# Patient Record
Sex: Male | Born: 1950
Health system: Southern US, Community
[De-identification: ages and names within clinical notes are randomized; demographics above are authoritative.]

## PROBLEM LIST (undated history)

## (undated) DIAGNOSIS — T7840XA Allergy, unspecified, initial encounter: Secondary | ICD-10-CM

## (undated) DIAGNOSIS — F32A Depression, unspecified: Secondary | ICD-10-CM

## (undated) DIAGNOSIS — J189 Pneumonia, unspecified organism: Secondary | ICD-10-CM

## (undated) DIAGNOSIS — E669 Obesity, unspecified: Secondary | ICD-10-CM

## (undated) DIAGNOSIS — K5790 Diverticulosis of intestine, part unspecified, without perforation or abscess without bleeding: Secondary | ICD-10-CM

## (undated) DIAGNOSIS — D509 Iron deficiency anemia, unspecified: Secondary | ICD-10-CM

## (undated) DIAGNOSIS — K219 Gastro-esophageal reflux disease without esophagitis: Secondary | ICD-10-CM

## (undated) DIAGNOSIS — E785 Hyperlipidemia, unspecified: Secondary | ICD-10-CM

## (undated) DIAGNOSIS — M1990 Unspecified osteoarthritis, unspecified site: Secondary | ICD-10-CM

## (undated) DIAGNOSIS — K635 Polyp of colon: Secondary | ICD-10-CM

## (undated) DIAGNOSIS — R7303 Prediabetes: Secondary | ICD-10-CM

## (undated) HISTORY — DX: Polyp of colon: K63.5

## (undated) HISTORY — DX: Prediabetes: R73.03

## (undated) HISTORY — DX: Gastro-esophageal reflux disease without esophagitis: K21.9

## (undated) HISTORY — DX: Allergy, unspecified, initial encounter: T78.40XA

## (undated) HISTORY — DX: Iron deficiency anemia, unspecified: D50.9

## (undated) HISTORY — DX: Unspecified osteoarthritis, unspecified site: M19.90

## (undated) HISTORY — DX: Obesity, unspecified: E66.9

## (undated) HISTORY — PX: BAND HEMORRHOIDECTOMY: SHX1213

## (undated) HISTORY — DX: Hyperlipidemia, unspecified: E78.5

## (undated) HISTORY — DX: Diverticulosis of intestine, part unspecified, without perforation or abscess without bleeding: K57.90

## (undated) HISTORY — PX: POLYPECTOMY: SHX149

## (undated) HISTORY — PX: UPPER GASTROINTESTINAL ENDOSCOPY: SHX188

## (undated) HISTORY — PX: TONSILLECTOMY: SUR1361

---

## 2002-06-10 HISTORY — PX: OTHER SURGICAL HISTORY: SHX169

## 2003-03-31 ENCOUNTER — Encounter: Payer: Self-pay | Admitting: Internal Medicine

## 2003-03-31 ENCOUNTER — Ambulatory Visit (HOSPITAL_COMMUNITY): Admission: RE | Admit: 2003-03-31 | Discharge: 2003-03-31 | Payer: Self-pay | Admitting: Gastroenterology

## 2003-03-31 ENCOUNTER — Encounter (INDEPENDENT_AMBULATORY_CARE_PROVIDER_SITE_OTHER): Payer: Self-pay | Admitting: *Deleted

## 2007-04-14 ENCOUNTER — Ambulatory Visit: Payer: Self-pay | Admitting: Internal Medicine

## 2007-04-14 DIAGNOSIS — Z860100 Personal history of colon polyps, unspecified: Secondary | ICD-10-CM | POA: Insufficient documentation

## 2007-04-14 DIAGNOSIS — F528 Other sexual dysfunction not due to a substance or known physiological condition: Secondary | ICD-10-CM | POA: Insufficient documentation

## 2007-04-14 DIAGNOSIS — E782 Mixed hyperlipidemia: Secondary | ICD-10-CM | POA: Insufficient documentation

## 2007-04-14 DIAGNOSIS — K625 Hemorrhage of anus and rectum: Secondary | ICD-10-CM | POA: Insufficient documentation

## 2007-04-14 DIAGNOSIS — K219 Gastro-esophageal reflux disease without esophagitis: Secondary | ICD-10-CM | POA: Insufficient documentation

## 2007-04-17 ENCOUNTER — Ambulatory Visit: Payer: Self-pay | Admitting: Internal Medicine

## 2007-04-17 DIAGNOSIS — D509 Iron deficiency anemia, unspecified: Secondary | ICD-10-CM | POA: Insufficient documentation

## 2007-04-17 DIAGNOSIS — R7981 Abnormal blood-gas level: Secondary | ICD-10-CM | POA: Insufficient documentation

## 2007-04-17 LAB — CONVERTED CEMR LAB
ALT: 22 units/L (ref 0–53)
Alkaline Phosphatase: 49 units/L (ref 39–117)
BUN: 20 mg/dL (ref 6–23)
Basophils Absolute: 0 10*3/uL (ref 0.0–0.1)
Bilirubin Urine: NEGATIVE
Calcium: 9.4 mg/dL (ref 8.4–10.5)
Direct LDL: 166.5 mg/dL
GFR calc Af Amer: 113 mL/min
Glucose, Bld: 106 mg/dL — ABNORMAL HIGH (ref 70–99)
HCT: 41.6 % (ref 39.0–52.0)
Ketones, ur: NEGATIVE mg/dL
MCHC: 33 g/dL (ref 30.0–36.0)
MCV: 82.7 fL (ref 78.0–100.0)
Monocytes Relative: 11.8 % — ABNORMAL HIGH (ref 3.0–11.0)
Neutrophils Relative %: 56.7 % (ref 43.0–77.0)
Potassium: 4.9 meq/L (ref 3.5–5.1)
RDW: 14.3 % (ref 11.5–14.6)
Sex Hormone Binding: 20 nmol/L (ref 13–71)
TSH: 1.45 microintl units/mL (ref 0.35–5.50)
Testosterone Free: 70.5 pg/mL (ref 47.0–244.0)
Urine Glucose: NEGATIVE mg/dL
VLDL: 42 mg/dL — ABNORMAL HIGH (ref 0–40)
WBC: 6.5 10*3/uL (ref 4.5–10.5)

## 2007-06-03 ENCOUNTER — Encounter: Payer: Self-pay | Admitting: Internal Medicine

## 2007-06-16 ENCOUNTER — Ambulatory Visit: Payer: Self-pay | Admitting: Internal Medicine

## 2007-10-12 ENCOUNTER — Encounter: Payer: Self-pay | Admitting: Internal Medicine

## 2008-08-30 ENCOUNTER — Ambulatory Visit: Payer: Self-pay | Admitting: Internal Medicine

## 2008-09-06 ENCOUNTER — Ambulatory Visit: Payer: Self-pay | Admitting: Internal Medicine

## 2008-09-06 ENCOUNTER — Telehealth: Payer: Self-pay | Admitting: Internal Medicine

## 2008-09-06 DIAGNOSIS — M791 Myalgia, unspecified site: Secondary | ICD-10-CM | POA: Insufficient documentation

## 2008-09-06 LAB — CONVERTED CEMR LAB
Alkaline Phosphatase: 54 units/L (ref 39–117)
HDL: 34.7 mg/dL — ABNORMAL LOW (ref >39.00)
LDL Cholesterol: 68 mg/dL (ref 0–99)
Total Bilirubin: 0.6 mg/dL (ref 0.3–1.2)
Total Protein: 7.5 g/dL (ref 6.0–8.3)
VLDL: 23.6 mg/dL (ref 0.0–40.0)

## 2008-09-19 ENCOUNTER — Telehealth: Payer: Self-pay | Admitting: Internal Medicine

## 2008-09-19 ENCOUNTER — Ambulatory Visit: Payer: Self-pay | Admitting: Internal Medicine

## 2008-09-19 LAB — CONVERTED CEMR LAB
LDL Cholesterol: 72 mg/dL (ref 0–99)
Total CK: 810 units/L — ABNORMAL HIGH (ref 7–232)
Triglycerides: 118 mg/dL (ref 0.0–149.0)

## 2008-11-23 ENCOUNTER — Encounter: Payer: Self-pay | Admitting: Internal Medicine

## 2009-01-23 ENCOUNTER — Ambulatory Visit: Payer: Self-pay | Admitting: Internal Medicine

## 2009-01-23 DIAGNOSIS — R5383 Other fatigue: Secondary | ICD-10-CM

## 2009-01-23 DIAGNOSIS — R5381 Other malaise: Secondary | ICD-10-CM | POA: Insufficient documentation

## 2009-01-23 LAB — CONVERTED CEMR LAB
BUN: 16 mg/dL (ref 6–23)
Basophils Relative: 1 % (ref 0–1)
CO2: 22 meq/L (ref 19–32)
Calcium: 9.5 mg/dL (ref 8.4–10.5)
Chloride: 107 meq/L (ref 96–112)
Creatinine, Ser: 1 mg/dL (ref 0.40–1.50)
Free T4: 0.95 ng/dL (ref 0.80–1.80)
Glucose, Bld: 104 mg/dL — ABNORMAL HIGH (ref 70–99)
HCT: 38.5 % — ABNORMAL LOW (ref 39.0–52.0)
Hemoglobin: 11.7 g/dL — ABNORMAL LOW (ref 13.0–17.0)
Lymphs Abs: 3.1 10*3/uL (ref 0.7–4.0)
MCHC: 30.4 g/dL (ref 30.0–36.0)
Monocytes Absolute: 1.2 10*3/uL — ABNORMAL HIGH (ref 0.1–1.0)
Monocytes Relative: 12 % (ref 3–12)
Neutro Abs: 5.8 10*3/uL (ref 1.7–7.7)
Neutrophils Relative %: 55 % (ref 43–77)
Platelets: 371 10*3/uL (ref 150–400)
RBC Folate: 1031 ng/mL — ABNORMAL HIGH (ref 180–600)
RBC: 5.61 M/uL (ref 4.22–5.81)
WBC: 10.4 10*3/uL (ref 4.0–10.5)

## 2009-02-05 ENCOUNTER — Encounter: Payer: Self-pay | Admitting: Internal Medicine

## 2009-02-23 ENCOUNTER — Ambulatory Visit: Payer: Self-pay | Admitting: Internal Medicine

## 2009-02-23 LAB — CONVERTED CEMR LAB
HDL: 40 mg/dL (ref >39)
LDL Cholesterol: 158 mg/dL — ABNORMAL HIGH (ref 0–99)
Total CHOL/HDL Ratio: 6.2
Triglycerides: 238 mg/dL — ABNORMAL HIGH (ref <150)
VLDL: 48 mg/dL — ABNORMAL HIGH (ref 0–40)

## 2009-03-02 ENCOUNTER — Ambulatory Visit: Payer: Self-pay | Admitting: Internal Medicine

## 2009-08-21 ENCOUNTER — Ambulatory Visit: Payer: Self-pay | Admitting: Internal Medicine

## 2009-08-21 LAB — CONVERTED CEMR LAB
Basophils Absolute: 0 10*3/uL (ref 0.0–0.1)
Basophils Relative: 1 % (ref 0–1)
Eosinophils Relative: 3 % (ref 0–5)
Hemoglobin: 13.4 g/dL (ref 13.0–17.0)
Lymphs Abs: 2.3 10*3/uL (ref 0.7–4.0)
Monocytes Absolute: 1 10*3/uL (ref 0.1–1.0)
Monocytes Relative: 14 % — ABNORMAL HIGH (ref 3–12)
Triglycerides: 238 mg/dL — ABNORMAL HIGH (ref <150)
WBC: 7.5 10*3/uL (ref 4.0–10.5)

## 2009-08-28 ENCOUNTER — Ambulatory Visit: Payer: Self-pay | Admitting: Internal Medicine

## 2009-08-28 DIAGNOSIS — H8110 Benign paroxysmal vertigo, unspecified ear: Secondary | ICD-10-CM | POA: Insufficient documentation

## 2009-11-06 ENCOUNTER — Emergency Department (HOSPITAL_COMMUNITY): Admission: EM | Admit: 2009-11-06 | Discharge: 2009-11-06 | Payer: Self-pay | Admitting: Emergency Medicine

## 2009-11-17 ENCOUNTER — Ambulatory Visit: Payer: Self-pay | Admitting: Interventional Radiology

## 2009-11-17 ENCOUNTER — Ambulatory Visit (HOSPITAL_BASED_OUTPATIENT_CLINIC_OR_DEPARTMENT_OTHER): Admission: RE | Admit: 2009-11-17 | Discharge: 2009-11-17 | Payer: Self-pay | Admitting: Internal Medicine

## 2009-11-17 ENCOUNTER — Ambulatory Visit: Payer: Self-pay | Admitting: Internal Medicine

## 2009-11-17 DIAGNOSIS — M79609 Pain in unspecified limb: Secondary | ICD-10-CM | POA: Insufficient documentation

## 2009-11-17 DIAGNOSIS — S2239XA Fracture of one rib, unspecified side, initial encounter for closed fracture: Secondary | ICD-10-CM | POA: Insufficient documentation

## 2010-03-05 ENCOUNTER — Ambulatory Visit: Payer: Self-pay | Admitting: Internal Medicine

## 2010-07-12 NOTE — Assessment & Plan Note (Signed)
Summary: Dizziness/vertigo/MHF   Vital Signs:  Patient profile:   60 year old male Height:      70 inches Weight:      247 pounds BMI:     35.57 O2 Sat:      97 % on Room air Temp:     97.9 degrees F oral Pulse rate:   74 / minute Pulse rhythm:   regular Resp:     18 per minute BP sitting:   124 / 70  (right arm) Cuff size:   large  Vitals Entered By: Glendell Docker CMA (August 28, 2009 8:28 AM)  O2 Flow:  Room air CC: Rm 2- CPX   Primary Care Provider:  Dondra Spry DO  CC:  Rm 2- CPX.  History of Present Illness: 60 y/o with hx of hyperlipidemia, anemia and obesity for f/u  last 3 wks - he co intermitent vertigo symptoms related to changes in position  hyperlipidemia - pt can not tolerate statins    Preventive Screening-Counseling & Management  Alcohol-Tobacco     Smoking Status: current- cigar  Allergies: 1)  ! Crestor (Rosuvastatin Calcium)  Past History:  Past Medical History: Hyperlipidemia Obesity  Colonic polyps, hx of  GERD      Social History: Smoking Status:  current- cigar  Physical Exam  General:  alert and overweight-appearing.   Head:  normocephalic and atraumatic.   Eyes:  pupils equal, pupils round, and pupils reactive to light.  no nystagmus Ears:  R ear normal and L ear normal.   Neck:  No deformities, masses, or tenderness noted. Lungs:  normal respiratory effort and normal breath sounds.   Heart:  normal rate, regular rhythm, no murmur, and no gallop.   Extremities:  No lower extremity edema    Impression & Recommendations:  Problem # 1:  BENIGN POSITIONAL VERTIGO (ICD-386.11) 60 y/o with intermittent veritigo.  we reviewed brandt daroff exercises.  If peristent symptoms, refer to vest rehab  Problem # 2:  ANEMIA, IRON DEFICIENCY (ICD-280.9) Assessment: Improved  Hgb: 13.4 (08/21/2009)   Hct: 45.5 (08/21/2009)   Platelets: 399 (08/21/2009) RBC: 6.29 (08/21/2009)   RDW: 16.5 (08/21/2009)   WBC: 7.5 (08/21/2009) MCV:  72.3 (08/21/2009)   MCHC: 29.5 (08/21/2009) B12: 564 (01/23/2009)   Folate: 1031 (01/23/2009)   TSH: 1.475 (01/23/2009)  Problem # 3:  HYPERLIPIDEMIA (ICD-272.2) Continue diet mgt  Labs Reviewed: SGOT: 22 (01/23/2009)   SGPT: 19 (01/23/2009)   HDL:41 (08/21/2009), 40 (02/23/2009)  LDL:159 (08/21/2009), 158 (02/23/2009)  Chol:248 (08/21/2009), 246 (02/23/2009)  Trig:238 (08/21/2009), 238 (02/23/2009)  Complete Medication List: 1)  Multivitamins Tabs (Multiple vitamin) .... Take 1 tablet by mouth once a day  Patient Instructions: 1)  [email or web address]-calorie-[email or web address]/ 2)  Limit calories to 2000-2200 cal per day 3)  Please schedule a follow-up appointment in 6 months CPX. 4)  Lipid Panel prior to visit, ICD-9: 272.4 5)  PSA prior to visit, ICD-9: v76.44 6)  Serum iron, TIBC, ferrition - 280.9 7)  Please return for lab work one (1) week before your next appointment.    Immunization History:  Influenza Immunization History:    Influenza:  declined (08/28/2009)   Current Allergies (reviewed today): ! CRESTOR (ROSUVASTATIN CALCIUM)
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
## 2010-10-26 NOTE — Op Note (Signed)
   Micheal Hamilton                        ACCOUNT NO.:  [id number]   MEDICAL RECORD NO.:  [id number]                   PATIENT TYPE:  AMB   LOCATION:  ENDO                                 FACILITY:  [facility]   PHYSICIAN:  John C. Madilyn Fireman, M.D.                 DATE OF BIRTH:  03-03-1951   DATE OF PROCEDURE:  03/31/2003  DATE OF DISCHARGE:                                 OPERATIVE REPORT   PROCEDURE:  Esophagogastroduodenoscopy with biopsy.   ENDOSCOPIST:  Everardo All. Madilyn Fireman, M.D.   INDICATIONS FOR PROCEDURE:  Anemia with hemoglobin 8.9, rectal bleeding,  nonbleeding diverticula and a small polyp seen on the preceding colonoscopy.   DESCRIPTION OF PROCEDURE:  The patient was placed in the left lateral  decubitus position and placed on the pulse monitor with continuous low flow  oxygen delivered by nasal cannula.  He was sedated with 100 mcg IV fentanyl  and 10 mg IV Versed for the previous colonoscopy, and no further sedation  was required for this procedure.   The Olympus video endoscope was advanced under direct vision into the  oropharynx and esophagus.  The esophagus was straight and of normal caliber  with the squamocolumnar line at 36 cm above a 2 cm hiatal hernia.  There was  no visible esophagitis, ring, stricture, or other abnormality of the GE  junction.   The stomach was entered, and a small amount of liquid secretions were  suctioned from the fundus.  Retroflexed view of the cardia confirmed a  hiatal hernia and was unremarkable.  The fundus, body, antrum, and pylorus  all appeared normal.  The duodenum was entered, and both the bulb and second  portion were well inspected and appeared to be within normal limits.  The  scope was advanced as far as possible down the distal duodenum and biopsies  obtained to rule out celiac disease.  The scope was then withdrawn.   The patient returned to the recovery room in stable condition.  He tolerated  the procedure well, and there  were no immediate complications.    IMPRESSION:  1. Hiatal hernia.  2. Otherwise, normal study.   PLAN:  Will await histology to rule out a sprue-like lesion causing iron  malabsorption.                                               John C. Madilyn Fireman, M.D.    JCH/MEDQ  D:  03/31/2003  T:  03/31/2003  Job:  [id number]   cc:   Marjory Lies, M.D.  [address]  [place]  [place] [address]  Fax: [phone]

## 2010-10-26 NOTE — Op Note (Signed)
   [name], Micheal Hamilton                        ACCOUNT NO.:  [id number]   MEDICAL RECORD NO.:  [id number]                   PATIENT TYPE:  AMB   LOCATION:  ENDO                                 FACILITY:  [facility]   PHYSICIAN:  John C. Madilyn Fireman, M.D.                 DATE OF BIRTH:  09-Jan-1951   DATE OF PROCEDURE:  03/31/2003  DATE OF DISCHARGE:                                 OPERATIVE REPORT   PROCEDURE:  Colonoscopy with polypectomy.   INDICATIONS FOR PROCEDURE:  Rectal bleeding with hemoglobin of 8.9.   DESCRIPTION OF PROCEDURE:  The patient was placed in the left lateral  decubitus position and placed on the pulse monitor with continuous low-flow  oxygen delivered by nasal cannula.  He was sedated with 100 mcg IV fentanyl  and 10 mg IV Versed.  The Olympus video colonoscope was inserted into the  rectum and advanced to the cecum, confirmed by transillumination at  McBurney's point and visualization of the ileocecal valve and appendiceal  orifice.  The prep was excellent.  The cecum, ascending, and transverse  colon all appeared normal with no masses, polyps, diverticula, or other  mucosal abnormalities.  Within the descending and sigmoid colon, there were  seen several scattered diverticula.  Also, there was a 10 mm sessile polyp  in the descending colon which was removed by snare.  No further polyps were  seen in the sigmoid or rectum.  Retroflexed view of the anus revealed only  minimal internal hemorrhoids.  The scope was then withdrawn, and the patient  returned to the recovery room in stable condition.  He tolerated the  procedure well, and there were no immediate complications.   IMPRESSION:  1. Descending colon polyp.  2. Descending and sigmoid diverticulosis.   PLAN:  1. Await biopsy results.  2. Proceed with EGD to look for alternate sources of GI blood loss.                                               John C. Madilyn Fireman, M.D.    JCH/MEDQ  D:  03/31/2003  T:   03/31/2003  Job:  [id number]   cc:   Marjory Lies, M.D.  [address]  [place]  [place] [address]  Fax: [phone]

## 2010-12-20 ENCOUNTER — Encounter: Payer: Self-pay | Admitting: Internal Medicine

## 2010-12-24 ENCOUNTER — Encounter: Payer: Self-pay | Admitting: Internal Medicine

## 2010-12-24 ENCOUNTER — Ambulatory Visit: Payer: Self-pay | Admitting: Internal Medicine

## 2010-12-25 ENCOUNTER — Encounter: Payer: Self-pay | Admitting: Internal Medicine

## 2011-01-01 ENCOUNTER — Encounter: Payer: Self-pay | Admitting: Internal Medicine

## 2011-01-11 ENCOUNTER — Encounter: Payer: Self-pay | Admitting: Internal Medicine

## 2011-01-11 ENCOUNTER — Ambulatory Visit (INDEPENDENT_AMBULATORY_CARE_PROVIDER_SITE_OTHER): Payer: BC Managed Care – PPO | Admitting: Internal Medicine

## 2011-01-11 DIAGNOSIS — R002 Palpitations: Secondary | ICD-10-CM

## 2011-01-11 DIAGNOSIS — R42 Dizziness and giddiness: Secondary | ICD-10-CM

## 2011-01-11 DIAGNOSIS — E785 Hyperlipidemia, unspecified: Secondary | ICD-10-CM

## 2011-01-11 DIAGNOSIS — E782 Mixed hyperlipidemia: Secondary | ICD-10-CM

## 2011-01-11 LAB — URINALYSIS
Ketones, ur: NEGATIVE mg/dL
Protein, ur: NEGATIVE mg/dL
Specific Gravity, Urine: 1.024 (ref 1.005–1.030)
Urobilinogen, UA: 0.2 mg/dL (ref 0.0–1.0)
pH: 6.5 (ref 5.0–8.0)

## 2011-01-11 LAB — LIPID PANEL
Cholesterol: 216 mg/dL — ABNORMAL HIGH (ref 0–200)
Total CHOL/HDL Ratio: 6.4 Ratio
Triglycerides: 247 mg/dL — ABNORMAL HIGH (ref <150)
VLDL: 49 mg/dL — ABNORMAL HIGH (ref 0–40)

## 2011-01-11 LAB — BASIC METABOLIC PANEL
BUN: 16 mg/dL (ref 6–23)
Chloride: 102 mEq/L (ref 96–112)
Glucose, Bld: 92 mg/dL (ref 70–99)
Potassium: 4.6 mEq/L (ref 3.5–5.3)

## 2011-01-11 LAB — HEPATIC FUNCTION PANEL
AST: 24 U/L (ref 0–37)
Albumin: 4.5 g/dL (ref 3.5–5.2)
Bilirubin, Direct: 0.1 mg/dL (ref 0.0–0.3)
Indirect Bilirubin: 0.4 mg/dL (ref 0.0–0.9)
Total Protein: 7.3 g/dL (ref 6.0–8.3)

## 2011-01-11 LAB — CBC WITH DIFFERENTIAL/PLATELET
Basophils Relative: 1 % (ref 0–1)
Eosinophils Relative: 2 % (ref 0–5)
HCT: 37.1 % — ABNORMAL LOW (ref 39.0–52.0)
Hemoglobin: 11.4 g/dL — ABNORMAL LOW (ref 13.0–17.0)
MCH: 21.8 pg — ABNORMAL LOW (ref 26.0–34.0)
MCV: 71.1 fL — ABNORMAL LOW (ref 78.0–100.0)
Monocytes Absolute: 1.1 10*3/uL — ABNORMAL HIGH (ref 0.1–1.0)
Neutrophils Relative %: 56 % (ref 43–77)
Platelets: 416 10*3/uL — ABNORMAL HIGH (ref 150–400)
RDW: 15.3 % (ref 11.5–15.5)

## 2011-01-11 LAB — TSH: TSH: 1.223 u[IU]/mL (ref 0.350–4.500)

## 2011-01-11 NOTE — Assessment & Plan Note (Signed)
Obtain lipid/lft. 

## 2011-01-11 NOTE — Progress Notes (Addendum)
  Subjective:    Patient ID: Micheal Hamilton, male    DOB: 11/18/1950, 60 y.o.   MRN: [id number]  HPI Pt presents to clinic for evaluation of hyperlipidemia and dizziness. For at least the last one month has noted fatigue and dizziness. No syncope, cp, or dyspnea. Has noted intermittent slight blurry vision but no neurologic deficits such as numbness, tingling, weakness or difficulty with speech. Dizziness not worsened with head movement but is worse with position change such as standing. Did on one occasion have a brief buzzing sensation at the left chest. Last occurrence was approximately 2 weeks ago. Recalls recent work physical but without labwork. ?h/o anemia in the past. No other exacerbating or alleviating factors. No other complaint.   Reviewed pmh, medications and allergies    Review of Systems see hpi    Objective:   Physical Exam  Nursing note and vitals reviewed. Constitutional: He appears well-developed and well-nourished. No distress.  HENT:  Head: Normocephalic and atraumatic.  Right Ear: Tympanic membrane, external ear and ear canal normal.  Left Ear: Tympanic membrane and ear canal normal.  Nose: Nose normal.  Mouth/Throat: Oropharynx is clear and moist. No oropharyngeal exudate.  Eyes: Conjunctivae and EOM are normal. Pupils are equal, round, and reactive to light. Right eye exhibits no discharge. Left eye exhibits no discharge. No scleral icterus.  Neck: Neck supple. No JVD present. Carotid bruit is not present.  Cardiovascular: Normal rate, regular rhythm and normal heart sounds.  Exam reveals no gallop and no friction rub.   No murmur heard. Pulmonary/Chest: Effort normal and breath sounds normal. No respiratory distress. He has no wheezes. He has no rales.  Lymphadenopathy:    He has no cervical adenopathy.  Neurological: He is alert. No cranial nerve deficit. Coordination normal.  Skin: Skin is warm and dry. He is not diaphoretic.  Psychiatric: He has a normal  mood and affect.          Assessment & Plan:

## 2011-01-11 NOTE — Assessment & Plan Note (Addendum)
EKG obtained demonstrates nsr 67 with nl intervals and axis.Obtain cbc, chem7, tsh and ua. Increase fluid intake. followup if no improvement or worsening.

## 2011-01-16 ENCOUNTER — Other Ambulatory Visit: Payer: Self-pay | Admitting: Internal Medicine

## 2011-01-16 DIAGNOSIS — D509 Iron deficiency anemia, unspecified: Secondary | ICD-10-CM

## 2011-07-15 ENCOUNTER — Ambulatory Visit: Payer: BC Managed Care – PPO | Admitting: Internal Medicine

## 2012-06-16 ENCOUNTER — Other Ambulatory Visit (INDEPENDENT_AMBULATORY_CARE_PROVIDER_SITE_OTHER): Payer: BC Managed Care – PPO

## 2012-06-16 DIAGNOSIS — Z0000 Encounter for general adult medical examination without abnormal findings: Secondary | ICD-10-CM

## 2012-06-16 LAB — POCT URINALYSIS DIPSTICK
Glucose, UA: NEGATIVE
Ketones, UA: NEGATIVE
Nitrite, UA: NEGATIVE
Protein, UA: NEGATIVE
Spec Grav, UA: 1.015
Urobilinogen, UA: 0.2
pH, UA: 5.5

## 2012-06-16 LAB — CBC WITH DIFFERENTIAL/PLATELET
Basophils Absolute: 0.1 10*3/uL (ref 0.0–0.1)
Eosinophils Relative: 2.4 % (ref 0.0–5.0)
HCT: 35 % — ABNORMAL LOW (ref 39.0–52.0)
Hemoglobin: 10.4 g/dL — ABNORMAL LOW (ref 13.0–17.0)
Neutro Abs: 4.9 10*3/uL (ref 1.4–7.7)
Platelets: 376 10*3/uL (ref 150.0–400.0)
RBC: 5.81 Mil/uL (ref 4.22–5.81)
WBC: 7.7 10*3/uL (ref 4.5–10.5)

## 2012-06-16 LAB — LIPID PANEL
Cholesterol: 208 mg/dL — ABNORMAL HIGH (ref 0–200)
HDL: 34.6 mg/dL — ABNORMAL LOW (ref >39.00)
Total CHOL/HDL Ratio: 6
VLDL: 35.8 mg/dL (ref 0.0–40.0)

## 2012-06-16 LAB — BASIC METABOLIC PANEL
BUN: 13 mg/dL (ref 6–23)
CO2: 28 mEq/L (ref 19–32)
Calcium: 9.6 mg/dL (ref 8.4–10.5)
Chloride: 105 mEq/L (ref 96–112)
Creatinine, Ser: 0.9 mg/dL (ref 0.4–1.5)
GFR: 94.82 mL/min (ref >60.00)
Glucose, Bld: 99 mg/dL (ref 70–99)

## 2012-06-16 LAB — HEPATIC FUNCTION PANEL
ALT: 18 U/L (ref 0–53)
Alkaline Phosphatase: 59 U/L (ref 39–117)
Bilirubin, Direct: 0.1 mg/dL (ref 0.0–0.3)
Total Bilirubin: 0.5 mg/dL (ref 0.3–1.2)

## 2012-06-16 LAB — HEMOGLOBIN A1C: Hgb A1c MFr Bld: 5.8 % (ref 4.6–6.5)

## 2012-06-16 LAB — PSA: PSA: 2.51 ng/mL (ref 0.10–4.00)

## 2012-06-22 ENCOUNTER — Encounter: Payer: Self-pay | Admitting: Internal Medicine

## 2012-06-22 ENCOUNTER — Ambulatory Visit (INDEPENDENT_AMBULATORY_CARE_PROVIDER_SITE_OTHER): Payer: BC Managed Care – PPO | Admitting: Internal Medicine

## 2012-06-22 VITALS — BP 120/60 | HR 96 | Temp 98.0°F | Ht 68.5 in | Wt 242.0 lb

## 2012-06-22 DIAGNOSIS — Z0000 Encounter for general adult medical examination without abnormal findings: Secondary | ICD-10-CM

## 2012-06-22 DIAGNOSIS — D509 Iron deficiency anemia, unspecified: Secondary | ICD-10-CM

## 2012-06-22 DIAGNOSIS — E875 Hyperkalemia: Secondary | ICD-10-CM

## 2012-06-22 DIAGNOSIS — E782 Mixed hyperlipidemia: Secondary | ICD-10-CM

## 2012-06-22 DIAGNOSIS — R195 Other fecal abnormalities: Secondary | ICD-10-CM

## 2012-06-22 LAB — BASIC METABOLIC PANEL
BUN: 13 mg/dL (ref 6–23)
Chloride: 101 mEq/L (ref 96–112)
Glucose, Bld: 93 mg/dL (ref 70–99)
Potassium: 4.6 mEq/L (ref 3.5–5.1)
Sodium: 139 mEq/L (ref 135–145)

## 2012-06-22 LAB — IBC PANEL: Transferrin: 387.4 mg/dL — ABNORMAL HIGH (ref 212.0–360.0)

## 2012-06-22 MED ORDER — FERROUS SULFATE CR 160 (50 FE) MG PO TBCR: 160.0000 mg | EXTENDED_RELEASE_TABLET | Freq: Every day | ORAL | Status: DC

## 2012-06-22 NOTE — Progress Notes (Signed)
Subjective:    Patient ID: Micheal Hamilton, male    DOB: 29-Jul-1950, 62 y.o.   MRN: [id number]  HPI  62 year old white male with history of hyperlipidemia, intolerant to statins and iron deficiency anemia for routine physical. Patient denies any significant interval medical history. He still working in Clinical research associate for [employer]. He complains of chronic fatigue and dyspnea with climbing stairs. This has been ongoing for several months. Patient also complains of flare of osteoarthritis symptoms. He seldom takes over-the-counter NSAIDs.  He has history of internal hemorrhoids. He still has intermittent rectal bleeding. He reports his symptoms have been less than usual recently. He denies any abdominal pain.  Results of Life Line screening reviewed.  See attached.  Review of Systems  Constitutional: chronic fatigue Eyes: Negative for visual disturbance.  Respiratory: Negative for cough, chest tightness .  Dyspnea with climbing stairs   Cardiovascular: Negative for chest pain.  Genitourinary: Negative for difficulty urinating.  Neurological: Negative for headaches.  Gastrointestinal: Negative for abdominal pain. Intermittent rectal bleeding Psych: Negative for depression or anxiety Endo:  No polyuria or polydypsia  Past Medical History  Diagnosis Date  . Hyperlipidemia   . Obesity   . Colon polyps     hx of  . GERD (gastroesophageal reflux disease)   . Iron deficiency anemia     Last EGD and colonoscopy performed by [facility]    History   Social History  . Marital Status: Married    Spouse Name: N/A    Number of Children: N/A  . Years of Education: N/A   Occupational History  . Enviromental Sales     [employer]   Social History Main Topics  . Smoking status: Former Games developer  . Smokeless tobacco: Not on file  . Alcohol Use: Yes  . Drug Use: No  . Sexually Active: Not on file   Other Topics Concern  . Not on file   Social History  Narrative   Regular exercise- No    Past Surgical History  Procedure Date  . Edg 2004    Dr Madilyn Fireman- hiatal hernia    Family History  Problem Relation Age of Onset  . Cancer Father     Lung  . Crohn's disease Sister   . Coronary artery disease Maternal Grandfather     Allergies  Allergen Reactions  . Rosuvastatin     REACTION: Myositis    Current Outpatient Prescriptions on File Prior to Visit  Medication Sig Dispense Refill  . ferrous sulfate dried (SLOW FE) 160 (50 FE) MG TBCR Take 1 tablet (160 mg total) by mouth daily.    0    BP 120/60  Pulse 96  Temp 98 F (36.7 C) (Oral)  Ht 5' 8.5" (1.74 m)  Wt 242 lb (109.77 kg)  BMI 36.26 kg/m2           Objective:   Physical Exam  Constitutional: He is oriented to person, place, and time. He appears well-developed and well-nourished.  HENT:  Head: Normocephalic and atraumatic.  Right Ear: External ear normal.  Left Ear: External ear normal.  Mouth/Throat: Oropharynx is clear and moist.  Eyes: EOM are normal. Pupils are equal, round, and reactive to light.  Neck: Normal range of motion. Neck supple.       No carotid bruit  Cardiovascular: Normal rate and regular rhythm.   No murmur heard. Pulmonary/Chest: Breath sounds normal.  Abdominal: Soft. Bowel sounds are normal. He exhibits no mass. There  is no tenderness.       No organomegaly, abdominal obesity  Genitourinary: Guaiac positive stool.       Internal hemorrhoid, no rectal mass  Musculoskeletal: He exhibits no edema.  Neurological: He is alert and oriented to person, place, and time. No cranial nerve deficit.  Skin: Skin is warm and dry.       Multiple skin tags around the neck  Psychiatric: He has a normal mood and affect. His behavior is normal.          Assessment & Plan:

## 2012-06-22 NOTE — Assessment & Plan Note (Signed)
Reviewed adult health maintenance protocols.  Encouraged weight loss. Patient declines influenza vaccine and tetanus vaccine. He is overdue for colonoscopy.

## 2012-06-22 NOTE — Patient Instructions (Addendum)
Please complete the following lab tests before your next follow up appointment: CBCD - 280.9

## 2012-06-22 NOTE — Assessment & Plan Note (Signed)
Patient has worsening microcytic anemia. He has guaiac positive stools. On rectal exam he has blood-tinged mucus.  Refer to gastroenterology for repeat colonoscopy. Also consider EGD. Patient advised to start oral iron replacement. He may need IV iron therapy.

## 2012-06-22 NOTE — Assessment & Plan Note (Addendum)
Patient intolerant of statins. Continue low saturated fat diet.  10 year ASCVD risk - 11%  Lab Results  Component Value Date   CHOL 208* 06/16/2012   HDL 34.60* 06/16/2012   LDLCALC 133* 01/11/2011   LDLDIRECT 159.2 06/16/2012   TRIG 179.0* 06/16/2012   CHOLHDL 6 06/16/2012

## 2012-07-30 ENCOUNTER — Encounter: Payer: Self-pay | Admitting: Gastroenterology

## 2012-08-14 ENCOUNTER — Ambulatory Visit: Payer: BC Managed Care – PPO | Admitting: Gastroenterology

## 2012-08-18 ENCOUNTER — Ambulatory Visit: Payer: BC Managed Care – PPO | Admitting: Gastroenterology

## 2012-09-01 ENCOUNTER — Other Ambulatory Visit: Payer: BC Managed Care – PPO

## 2012-09-01 ENCOUNTER — Encounter: Payer: Self-pay | Admitting: Gastroenterology

## 2012-09-01 ENCOUNTER — Ambulatory Visit (INDEPENDENT_AMBULATORY_CARE_PROVIDER_SITE_OTHER): Payer: BC Managed Care – PPO | Admitting: Gastroenterology

## 2012-09-01 VITALS — BP 118/68 | HR 88 | Ht 67.25 in | Wt 239.5 lb

## 2012-09-01 DIAGNOSIS — D509 Iron deficiency anemia, unspecified: Secondary | ICD-10-CM

## 2012-09-01 DIAGNOSIS — R195 Other fecal abnormalities: Secondary | ICD-10-CM

## 2012-09-01 MED ORDER — MOVIPREP 100 G PO SOLR: 1.0000 | Freq: Once | ORAL | Status: DC

## 2012-09-01 NOTE — Patient Instructions (Addendum)
You will be set up for a colonoscopy (fobt positive stool, iron deficiency anemia) +/- EGD at same time if no clear cause found by colonoscopy (LEC, moderate sedation). We will get records sent from your previous gastroenterologist for review [facility] colonoscopy and upper endoscopy).  This will include any endoscopic (colonoscopy or upper endoscopy) procedures and any associated pathology reports.   You will have labs checked today in the basement lab.  Please head down after you check out with the front desk  (celiac panel). Please start taking citrucel (orange flavored) powder fiber supplement.  This may cause some bloating at first but that usually goes away. Begin with a small spoonful and work your way up to a large, heaping spoonful daily over a week.                                                We are excited to introduce MyChart, a new best-in-class service that provides you online access to important information in your electronic medical record. We want to make it easier for you to view your health information - all in one secure location - when and where you need it. We expect MyChart will enhance the quality of care and service we provide.  When you register for MyChart, you can:    View your test results.    Request appointments and receive appointment reminders via email.    Request medication renewals.    View your medical history, allergies, medications and immunizations.    Communicate with your physician's office through a password-protected site.    Conveniently print information such as your medication lists.  To find out if MyChart is right for you, please talk to a member of our clinical staff today. We will gladly answer your questions about this free health and wellness tool.  If you are age 61 or older and want a member of your family to have access to your record, you must provide written consent by completing a proxy form available at our office.  Please speak to our clinical staff about guidelines regarding accounts for patients younger than age 61.  As you activate your MyChart account and need any technical assistance, please call the MyChart technical support line at [phone]) or email your question to [email or web address]. If you email your question(s), please include your name, a return phone number and the best time to reach you.  If you have non-urgent health-related questions, you can send a message to our office through MyChart at [email or web address]. If you have a medical emergency, call 911.  Thank you for using MyChart as your new health and wellness resource!   MyChart licensed from [employer],  [id number]. Patents Pending.

## 2012-09-01 NOTE — Progress Notes (Signed)
HPI: This is a   very pleasant 62 year old man whom I am meeting for the first time today.  Chronically anemic for at least 2004.  Labs 06/2012: Hb 10.4, MCV in 60s, low iron, low ferritin.  FOBT + 07/2012 in office by Dr. Artist Pais.  Looking back in records, he has had IDA, microcytosis for almost 2 years.  He started iron supplement 1-2 months ago.  EGD/Colonoscopy Dr. Madilyn Fireman 2004.  Small polyp, also diverticulosis pathology report shows TA was removed. Also duodenum biopsied and it was normal (no villous atrophy). Described a 2-3cm HH  Had repeat colonoscopy with Dr. Noe Gens in [place] 5-7 years ago. Told him he had Barrett's esophagus.  Also found polyps, removed them and recommended repeat colonoscopy in 5 years.  Hemorrhoids treated.  He has hemorrhoids, he declines to have them evaluated today.  Bleeds about every day from the hemorrhoids.  Sees dripping blood into the toilette with most BMs.  He has to periodically push and strain to move his bowels.  Has never really tried anything to ease his straining.  Sister with crohn's    Review of systems: Pertinent positive and negative review of systems were noted in the above HPI section. Complete review of systems was performed and was otherwise normal.    Past Medical History  Diagnosis Date  . Hyperlipidemia   . Obesity   . Colon polyps     hx of  . GERD (gastroesophageal reflux disease)   . Iron deficiency anemia     Last EGD and colonoscopy performed by [facility]  . Barrett's esophagus   . Diverticulosis     Past Surgical History  Procedure Laterality Date  . Edg  2004    Dr Madilyn Fireman- hiatal hernia  . Band hemorrhoidectomy    . Tonsillectomy      Current Outpatient Prescriptions  Medication Sig Dispense Refill  . ferrous sulfate 325 (65 FE) MG tablet Take 325 mg by mouth daily with breakfast.       No current facility-administered medications for this visit.    Allergies as of 09/01/2012 - Review Complete  09/01/2012  Allergen Reaction Noted  . Crestor (rosuvastatin)  09/19/2008    Family History  Problem Relation Age of Onset  . Lung cancer Father   . Crohn's disease Sister   . Diabetes Cousin   . Heart disease Maternal Uncle     History   Social History  . Marital Status: Married    Spouse Name: N/A    Number of Children: 3  . Years of Education: N/A   Occupational History  . Enviromental Sales     [employer]   Social History Main Topics  . Smoking status: Former Smoker    Types: Cigarettes    Quit date: 06/10/1969  . Smokeless tobacco: Never Used  . Alcohol Use: Yes     Comment: < than 1 per day  . Drug Use: No  . Sexually Active: Not on file   Other Topics Concern  . Not on file   Social History Narrative   Regular exercise- No       Physical Exam: BP 118/68  Pulse 88  Ht 5' 7.25" (1.708 m)  Wt 239 lb 8 oz (108.636 kg)  BMI 37.24 kg/m2 Constitutional: generally well-appearing Psychiatric: alert and oriented x3 Eyes: extraocular movements intact Mouth: oral pharynx moist, no lesions Neck: supple no lymphadenopathy Cardiovascular: heart regular rate and rhythm Lungs: clear to auscultation bilaterally Abdomen: soft, nontender,  nondistended, no obvious ascites, no peritoneal signs, normal bowel sounds Extremities: no lower extremity edema bilaterally Skin: no lesions on visible extremities Rectal exam declined per patient   Assessment and plan: 62 y.o. male with  near daily rectal bleeding, chronic iron deficiency anemia, history polyps  We will get records from his previous gastroenterologist in [place]. He has nearly daily rectal bleeding and previously documented hemorrhoids. He declined rectal exam today however. This frequent bleeding from his rectum could certainly cause iron deficiency anemia chronically. He has not had colonoscopy in 5-7 years and has had adenomatous polyps in past. I like to repeat his colonoscopy at this point  plus consider upper endoscopy at the same time if no clear bleeding site, sources found. I am also going to have him get celiac sprue testing today

## 2012-09-02 LAB — CELIAC PANEL 10
Gliadin IgA: 3.3 U/mL (ref <20)
Gliadin IgG: 11.8 U/mL (ref <20)
IgA: 145 mg/dL (ref 68–379)
Tissue Transglutaminase Ab, IgA: 2.8 U/mL (ref <20)

## 2012-09-04 ENCOUNTER — Telehealth: Payer: Self-pay | Admitting: Gastroenterology

## 2012-09-04 NOTE — Telephone Encounter (Signed)
EGD report 10/2008, Dr. Talmadge Chad; done for IDA; found "duodentitis, gastritis, esophagitis"  Pathology showed no barrett's  Colonoscopy pathology report from 10/2008: "tubular adenoma without HGD"   Patty, They sent the colonoscopy path report but not the actual colonoscopy report. Can you call Toma Copier again for them to send the actual colonoscopy report.

## 2012-09-04 NOTE — Telephone Encounter (Signed)
Micheal Hamilton was called and Colon report requested the chart is in storage and will be requested

## 2012-09-04 NOTE — Telephone Encounter (Signed)
Ok, thanks.

## 2012-10-08 ENCOUNTER — Telehealth: Payer: Self-pay | Admitting: Gastroenterology

## 2012-10-08 NOTE — Telephone Encounter (Signed)
FYI Dr Christella Hartigan pt cx ENDO but will still have colon

## 2012-10-09 NOTE — Telephone Encounter (Signed)
ok 

## 2012-10-14 ENCOUNTER — Encounter: Payer: Self-pay | Admitting: Gastroenterology

## 2012-10-14 ENCOUNTER — Encounter: Payer: BC Managed Care – PPO | Admitting: Gastroenterology

## 2012-10-14 ENCOUNTER — Ambulatory Visit (AMBULATORY_SURGERY_CENTER): Payer: BC Managed Care – PPO | Admitting: Gastroenterology

## 2012-10-14 VITALS — BP 120/90 | HR 72 | Temp 97.0°F | Resp 16 | Ht 67.0 in | Wt 239.0 lb

## 2012-10-14 DIAGNOSIS — K5730 Diverticulosis of large intestine without perforation or abscess without bleeding: Secondary | ICD-10-CM

## 2012-10-14 DIAGNOSIS — K644 Residual hemorrhoidal skin tags: Secondary | ICD-10-CM

## 2012-10-14 DIAGNOSIS — D126 Benign neoplasm of colon, unspecified: Secondary | ICD-10-CM

## 2012-10-14 DIAGNOSIS — R195 Other fecal abnormalities: Secondary | ICD-10-CM

## 2012-10-14 DIAGNOSIS — K648 Other hemorrhoids: Secondary | ICD-10-CM

## 2012-10-14 HISTORY — PX: COLONOSCOPY: SHX174

## 2012-10-14 MED ORDER — SODIUM CHLORIDE 0.9 % IV SOLN: 500.0000 mL | INTRAVENOUS | Status: DC

## 2012-10-14 NOTE — Op Note (Signed)
[facility] [address] [place], [address]   COLONOSCOPY PROCEDURE REPORT  PATIENT: Micheal Hamilton, Micheal Hamilton  MR#: [id number] BIRTHDATE: Jul 07, 1950 , 62  yrs. old GENDER: Male ENDOSCOPIST: Rachael Fee, MD REFERRED QI:ONGEXB Artist Pais, DO PROCEDURE DATE:  10/14/2012 PROCEDURE:   Colonoscopy with snare polypectomy ASA CLASS:   Class II INDICATIONS:colonoscopy 2010 with TA removed (unclear size), also colonoscopy prior to that with TA (both elsewhere).  Daily rectal bleeding, IDA.Marland Kitchen MEDICATIONS: Fentanyl 75 mcg IV, Versed 7 mg IV, and These medications were titrated to patient response per physician's verbal order  DESCRIPTION OF PROCEDURE:   After the risks benefits and alternatives of the procedure were thoroughly explained, informed consent was obtained.  A digital rectal exam revealed no abnormalities of the rectum.   The LB [id number]-[id number]  endoscope was introduced through the anus and advanced to the cecum, which was identified by both the appendix and ileocecal valve. No adverse events experienced.   The quality of the prep was good.  The instrument was then slowly withdrawn as the colon was fully examined.  COLON FINDINGS: There were medium sized internal and external hemorrhoids.  There were several diverticulum in left colon.  There was a sessile polyp in descending colon, this was 6mm, removed with cold snare.  The examiantion was otherwise normal.  Retroflexed views revealed no abnormalities. The time to cecum=2 minutes 06 seconds.  Withdrawal time=11 minutes 06 seconds.  The scope was withdrawn and the procedure completed. COMPLICATIONS: There were no complications. ENDOSCOPIC IMPRESSION: There were medium sized internal and external hemorrhoids.  There were several diverticulum in left colon.  There was a sessile polyp in descending colon, this was 6mm, removed with cold snare.  The examiantion was otherwise normal.  RECOMMENDATIONS: 1. If the  polyp(s) removed today are proven to be adenomatous (pre-cancerous) polyps, you will need a repeat colonoscopy in 5 years.  Otherwise you should continue to follow colorectal cancer screening guidelines for "routine risk" patients with colonoscopy in 10 years.  You will receive a letter within 1-2 weeks with the results of your biopsy as well as final recommendations.  Please call my office if you have not received a letter after 3 weeks. 2. My office will work on referral to general surgery to discuss hemorrhoid therapy for your hemorrhoids that bleed daily despite trial of conservative measures. This bleeding is most likely the cause of your anemia.   eSigned:  Rachael Fee, MD 10/14/2012 9:45 AM

## 2012-10-14 NOTE — Patient Instructions (Addendum)

## 2012-10-14 NOTE — Progress Notes (Signed)
Patient did not experience any of the following events: a burn prior to discharge; a fall within the facility; wrong site/side/patient/procedure/implant event; or a hospital transfer or hospital admission upon discharge from the facility. (G8907) Patient did not have preoperative order for IV antibiotic SSI prophylaxis. (G8918)  

## 2012-10-15 ENCOUNTER — Telehealth: Payer: Self-pay | Admitting: *Deleted

## 2012-10-15 DIAGNOSIS — K649 Unspecified hemorrhoids: Secondary | ICD-10-CM

## 2012-10-15 NOTE — Telephone Encounter (Signed)
  Follow up Call-  Call back number 10/14/2012  Post procedure Call Back phone  # [phone]  Permission to leave phone message Yes     Patient questions:  Do you have a fever, pain , or abdominal swelling? no Pain Score  0 *  Have you tolerated food without any problems? yes  Have you been able to return to your normal activities? yes  Do you have any questions about your discharge instructions: Diet   no Medications  no Follow up visit  no  Do you have questions or concerns about your Care? no  Actions: * If pain score is 4 or above: No action needed, pain <4.

## 2012-10-15 NOTE — Telephone Encounter (Signed)
Spoke with Micheal Hamilton and scheduled patient on 10/29/12 at 1:00/1:30 PM with Dr. Romie Levee. Patient given appointment date and time but he is driving. He will call tomorrow to get the information when he can write it down.

## 2012-10-15 NOTE — Telephone Encounter (Signed)
Called [facility] to schedule appointment. Left a message for Liborio Nixon to call me.(new patient coordinator)

## 2012-10-16 NOTE — Telephone Encounter (Signed)
Spoke with patient and gave him appointment date and time. 

## 2012-10-20 ENCOUNTER — Encounter: Payer: Self-pay | Admitting: Gastroenterology

## 2012-10-29 ENCOUNTER — Encounter (INDEPENDENT_AMBULATORY_CARE_PROVIDER_SITE_OTHER): Payer: Self-pay | Admitting: General Surgery

## 2012-10-29 ENCOUNTER — Ambulatory Visit (INDEPENDENT_AMBULATORY_CARE_PROVIDER_SITE_OTHER): Payer: BC Managed Care – PPO | Admitting: General Surgery

## 2012-10-29 VITALS — BP 142/90 | HR 80 | Temp 98.7°F | Resp 16 | Ht 69.0 in | Wt 237.2 lb

## 2012-10-29 DIAGNOSIS — K625 Hemorrhage of anus and rectum: Secondary | ICD-10-CM

## 2012-10-29 NOTE — Progress Notes (Signed)
Chief Complaint  Patient presents with  . New Evaluation    eval hems    HISTORY: Micheal Hamilton is a 62 y.o. male who presents to the office with rectal bleeding.  This had been occurring for years.  He has tried banding in the past but this didn't help.  Stress seems makes the symptoms worse.  He drives a lot for work.  It is continuous in nature.  His bowel habits are regular and his bowel movements are sometimes.  He denies any straining to have a BM or urinate.  His fiber intake is moderate.  He has been using a fiber supplement for about 3 weeks now.  His last colonoscopy was earlier this month.  He denies prolapsing tissue.     Past Medical History  Diagnosis Date  . Hyperlipidemia   . Obesity   . Colon polyps     hx of  . GERD (gastroesophageal reflux disease)   . Iron deficiency anemia     Last EGD and colonoscopy performed by [facility]  . Barrett's esophagus   . Diverticulosis       Past Surgical History  Procedure Laterality Date  . Edg  2004    Dr Madilyn Fireman- hiatal hernia  . Band hemorrhoidectomy    . Tonsillectomy          Current Outpatient Prescriptions  Medication Sig Dispense Refill  . ferrous sulfate 325 (65 FE) MG tablet Take 325 mg by mouth daily with breakfast.       No current facility-administered medications for this visit.      Allergies  Allergen Reactions  . Crestor (Rosuvastatin)     REACTION: Myositis      Family History  Problem Relation Age of Onset  . Lung cancer Father   . Cancer Father     lung  . Crohn's disease Sister   . Diabetes Cousin   . Heart disease Maternal Uncle     History   Social History  . Marital Status: Married    Spouse Name: N/A    Number of Children: 3  . Years of Education: N/A   Occupational History  . Enviromental Sales     [employer]   Social History Main Topics  . Smoking status: Former Smoker    Types: Cigarettes    Quit date: 06/10/1969  . Smokeless tobacco: Never Used  .  Alcohol Use: Yes     Comment: < than 1 per day  . Drug Use: No  . Sexually Active: None   Other Topics Concern  . None   Social History Narrative   Regular exercise- No      REVIEW OF SYSTEMS - PERTINENT POSITIVES ONLY: Review of Systems - General ROS: negative for - chills, fever or weight loss Hematological and Lymphatic ROS: negative for - bleeding problems, blood clots or bruising Respiratory ROS: no cough, shortness of breath, or wheezing Cardiovascular ROS: no chest pain or dyspnea on exertion Gastrointestinal ROS: no abdominal pain, change in bowel habits, or black or bloody stools Genito-Urinary ROS: no dysuria, trouble voiding, or hematuria  EXAM: Filed Vitals:   10/29/12 1336  BP: 142/90  Pulse: 80  Temp: 98.7 F (37.1 C)  Resp: 16    General appearance: alert and cooperative Resp: clear to auscultation bilaterally Cardio: regular rate and rhythm GI: soft, non-tender; bowel sounds normal; no masses,  no organomegaly   Procedure: Anoscopy Surgeon: Maisie Fus Diagnosis: rectal bleeding  Assistant: Christella Scheuermann After the  risks and benefits were explained, verbal consent was obtained for above procedure  Anesthesia: none Findings: L lateral grade 3 hemorrhoid, R ant and post grade 2 hemorrhoids   ASSESSMENT AND PLAN: Micheal Hamilton is a 62 y.o. M with a long standing h/o bleeding per rectum.  He has anemia.  His colonoscopy did not show any other cause of bleeding.  He has tried fiber supplements and banding with little success.  I think he would be a good candidate for THD.  I have asked him to continue the fiber supplements daily.  We discussed the risks and alternatives to surgery. These mainly include pain, bleeding, urinary retention, infection and recurrence. I believe he understands these risks and has agreed to proceed.        Vanita Panda, MD Colon and Rectal Surgery / General Surgery [facility]      Visit Diagnoses: 1.  Rectal bleeding     Primary Care Physician: Thomos Lemons, DO

## 2012-10-29 NOTE — Patient Instructions (Signed)
Fiber Chart  You should 25-30g of fiber per day and drinking 8 glasses of water to help your bowels move regularly.  In the chart below you can look up how much fiber you are getting in an average day.  If you are not getting enough fiber, you should add a fiber supplement to your diet.  Examples of this include Metamucil, FiberCon and Citrucel.  These can be purchased at your local grocery store or pharmacy.      http://www.[employer].edu/offices/health/nutritioncoach/AtoZ/handouts/Fiber.pdf   HEMORRHOIDS    Did you know... Hemorrhoids are one of the most common ailments known.  More than half the population will develop hemorrhoids, usually after age 30.  Millions of Americans currently suffer from hemorrhoids.  The average person suffers in silence for a long period before seeking medical care.  Today's treatment methods make some types of hemorrhoid removal much less painful.  What are hemorrhoids? Often described as "varicose veins of the anus and rectum", hemorrhoids are enlarged, bulging blood vessels in and about the anus and lower rectum. There are two types of hemorrhoids: external and internal, which refer to their location.  External (outside) hemorrhoids develop near the anus and are covered by very sensitive skin. These are usually painless. However, if a blood clot (thrombosis) develops in an external hemorrhoid, it becomes a painful, hard lump. The external hemorrhoid may bleed if it ruptures. Internal (inside) hemorrhoids develop within the anus beneath the lining. Painless bleeding and protrusion during bowel movements are the most common symptom. However, an internal hemorrhoid can cause severe pain if it is completely "prolapsed" - protrudes from the anal opening and cannot be pushed back inside.   What causes hemorrhoids? An exact cause is unknown; however, the upright posture of humans alone forces a great deal of pressure on the rectal veins, which sometimes causes them  to bulge. Other contributing factors include:  . Aging  . Chronic constipation or diarrhea  . Pregnancy  . Heredity  . Straining during bowel movements  . Faulty bowel function due to overuse of laxatives or enemas . Spending long periods of time (e.g., reading) on the toilet  Whatever the cause, the tissues supporting the vessels stretch. As a result, the vessels dilate; their walls become thin and bleed. If the stretching and pressure continue, the weakened vessels protrude.  What are the symptoms? If you notice any of the following, you could have hemorrhoids:  . Bleeding during bowel movements  . Protrusion during bowel movements . Itching in the anal area  . Pain  . Sensitive lump(s)  How are hemorrhoids treated? Mild symptoms can be relieved frequently by increasing the amount of fiber (e.g., fruits, vegetables, breads and cereals) and fluids in the diet. Eliminating excessive straining reduces the pressure on hemorrhoids and helps prevent them from protruding. A sitz bath - sitting in plain warm water for about 10 minutes - can also provide some relief . With these measures, the pain and swelling of most symptomatic hemorrhoids will decrease in two to seven days, and the firm lump should recede within four to six weeks. In cases of severe or persistent pain from a thrombosed hemorrhoid, your physician may elect to remove the hemorrhoid containing the clot with a small incision. Performed under local anesthesia as an outpatient, this procedure generally provides relief. Severe hemorrhoids may require special treatment, much of which can be performed on an outpatient basis.  . Ligation - the rubber band treatment - works effectively on internal hemorrhoids that   protrude with bowel movements. A small rubber band is placed over the hemorrhoid, cutting off its blood supply. The hemorrhoid and the band fall off in a few days and the wound usually heals in a week or two. This procedure  sometimes produces mild discomfort and bleeding and may need to be repeated for a full effect.  There is a more intense version of this procedure that is done in the OR as outpatient surgery called THD.  It involves identifying blood vessels leading to the hemorrhoids and then tying them off with sutures.  This method is a little more painful than rubber band ligation but less painful than traditional hemorrhoidectomy and usually does not have to be repeated.  It is best for internal hemorrhoids that bleed.  Rubber Band Ligation of Internal Hemorrhoids:  A.  Bulging, bleeding, internal hemorrhoid B.  Rubber band applied at the base of the hemorrhoid C.  About 7 days later, the banded hemorrhoid has fallen off leaving a small scar (arrow)  . Injection and Coagulation can also be used on bleeding hemorrhoids that do not protrude. Both methods are relatively painless and cause the hemorrhoid to shrivel up. . Hemorrhoidectomy - surgery to remove the hemorrhoids - is the most complete method for removal of internal and external hemorrhoids. It is necessary when (1) clots repeatedly form in external hemorrhoids; (2) ligation fails to treat internal hemorrhoids; (3) the protruding hemorrhoid cannot be reduced; or (4) there is persistent bleeding. A hemorrhoidectomy removes excessive tissue that causes the bleeding and protrusion. It is done under anesthesia using sutures, and may, depending upon circumstances, require hospitalization and a period of inactivity. Laser hemorrhoidectomies do not offer any advantage over standard operative techniques. They are also quite expensive, and contrary to popular belief, are no less painful.  Do hemorrhoids lead to cancer? No. There is no relationship between hemorrhoids and cancer. However, the symptoms of hemorrhoids, particularly bleeding, are similar to those of colorectal cancer and other diseases of the digestive system. Therefore, it is important that all symptoms  are investigated by a physician specially trained in treating diseases of the colon and rectum and that everyone 50 years or older undergo screening tests for colorectal cancer. Do not rely on over-the-counter medications or other self-treatments. See a colorectal surgeon first so your symptoms can be properly evaluated and effective treatment prescribed.  2012 American Society of Colon & Rectal Surgeons     

## 2013-07-19 ENCOUNTER — Encounter: Payer: Self-pay | Admitting: Internal Medicine

## 2013-07-19 ENCOUNTER — Ambulatory Visit (INDEPENDENT_AMBULATORY_CARE_PROVIDER_SITE_OTHER): Payer: BC Managed Care – PPO | Admitting: Internal Medicine

## 2013-07-19 VITALS — BP 140/88 | Temp 98.3°F | Ht 69.0 in | Wt 248.0 lb

## 2013-07-19 DIAGNOSIS — M791 Myalgia, unspecified site: Secondary | ICD-10-CM

## 2013-07-19 DIAGNOSIS — R0602 Shortness of breath: Secondary | ICD-10-CM

## 2013-07-19 DIAGNOSIS — M6530 Trigger finger, unspecified finger: Secondary | ICD-10-CM | POA: Insufficient documentation

## 2013-07-19 DIAGNOSIS — IMO0001 Reserved for inherently not codable concepts without codable children: Secondary | ICD-10-CM

## 2013-07-19 DIAGNOSIS — R0989 Other specified symptoms and signs involving the circulatory and respiratory systems: Secondary | ICD-10-CM

## 2013-07-19 DIAGNOSIS — E782 Mixed hyperlipidemia: Secondary | ICD-10-CM

## 2013-07-19 DIAGNOSIS — R0600 Dyspnea, unspecified: Secondary | ICD-10-CM

## 2013-07-19 DIAGNOSIS — D509 Iron deficiency anemia, unspecified: Secondary | ICD-10-CM

## 2013-07-19 DIAGNOSIS — R0609 Other forms of dyspnea: Secondary | ICD-10-CM

## 2013-07-19 LAB — HEPATIC FUNCTION PANEL
ALBUMIN: 4.2 g/dL (ref 3.5–5.2)
ALT: 16 U/L (ref 0–53)
AST: 21 U/L (ref 0–37)
Alkaline Phosphatase: 47 U/L (ref 39–117)
BILIRUBIN DIRECT: 0 mg/dL (ref 0.0–0.3)
TOTAL PROTEIN: 7.2 g/dL (ref 6.0–8.3)
Total Bilirubin: 0.4 mg/dL (ref 0.3–1.2)

## 2013-07-19 LAB — BASIC METABOLIC PANEL
BUN: 10 mg/dL (ref 6–23)
CALCIUM: 8.9 mg/dL (ref 8.4–10.5)
CHLORIDE: 105 meq/L (ref 96–112)
CO2: 25 meq/L (ref 19–32)
Creatinine, Ser: 0.9 mg/dL (ref 0.4–1.5)
GFR: 90.86 mL/min (ref >60.00)
GLUCOSE: 89 mg/dL (ref 70–99)
Potassium: 3.9 mEq/L (ref 3.5–5.1)
SODIUM: 138 meq/L (ref 135–145)

## 2013-07-19 LAB — CBC WITH DIFFERENTIAL/PLATELET
BASOS PCT: 0.8 % (ref 0.0–3.0)
Basophils Absolute: 0.1 10*3/uL (ref 0.0–0.1)
EOS ABS: 0.3 10*3/uL (ref 0.0–0.7)
EOS PCT: 3.3 % (ref 0.0–5.0)
HEMATOCRIT: 27.2 % — AB (ref 39.0–52.0)
Hemoglobin: 8.3 g/dL — ABNORMAL LOW (ref 13.0–17.0)
Lymphocytes Relative: 22.4 % (ref 12.0–46.0)
Lymphs Abs: 1.8 10*3/uL (ref 0.7–4.0)
MCHC: 29.1 g/dL — AB (ref 30.0–36.0)
MCV: 61.5 fl — AB (ref 78.0–100.0)
MONO ABS: 1.1 10*3/uL — AB (ref 0.1–1.0)
Monocytes Relative: 13.9 % — ABNORMAL HIGH (ref 3.0–12.0)
NEUTROS PCT: 59.6 % (ref 43.0–77.0)
Neutro Abs: 4.7 10*3/uL (ref 1.4–7.7)
Platelets: 433 10*3/uL — ABNORMAL HIGH (ref 150.0–400.0)
RBC: 4.56 Mil/uL (ref 4.22–5.81)
RDW: 17.2 % — ABNORMAL HIGH (ref 11.5–14.6)
WBC: 7.9 10*3/uL (ref 4.5–10.5)

## 2013-07-19 LAB — CK: CK TOTAL: 201 U/L (ref 7–232)

## 2013-07-19 LAB — LIPID PANEL
CHOLESTEROL: 200 mg/dL (ref 0–200)
HDL: 32.6 mg/dL — AB (ref >39.00)
TRIGLYCERIDES: 292 mg/dL — AB (ref 0.0–149.0)
Total CHOL/HDL Ratio: 6
VLDL: 58.4 mg/dL — ABNORMAL HIGH (ref 0.0–40.0)

## 2013-07-19 LAB — SEDIMENTATION RATE: Sed Rate: 15 mm/hr (ref 0–22)

## 2013-07-19 LAB — T4, FREE: Free T4: 0.89 ng/dL (ref 0.60–1.60)

## 2013-07-19 LAB — TSH: TSH: 2.07 u[IU]/mL (ref 0.35–5.50)

## 2013-07-19 LAB — BRAIN NATRIURETIC PEPTIDE: Pro B Natriuretic peptide (BNP): 19 pg/mL (ref 0.0–100.0)

## 2013-07-19 NOTE — Progress Notes (Signed)
Pre visit review using our clinic review tool, if applicable. No additional management support is needed unless otherwise documented below in the visit note. 

## 2013-07-19 NOTE — Assessment & Plan Note (Signed)
Patient complains of intermittent bilateral leg weakness. His neurologic exam is unremarkable. Patient able to perform 5 squats without difficulty. Check CPK and sedimentation rate. If persistent symptoms, consider MRI of lumbar spine.

## 2013-07-19 NOTE — Assessment & Plan Note (Signed)
63 year old white male with unexplained intermittent dyspnea. Patient not having any symptoms of angina. I doubt dyspnea secondary to anemia. He has not had any significant rectal bleeding in the recent past. Check CBC. Obtain BNP and chest x-ray. Obtain 2-D echocardiogram.  EKG shows normal sinus rhythm at 73 beats per minute. No ST changes

## 2013-07-19 NOTE — Progress Notes (Signed)
Subjective:    Patient ID: Micheal Hamilton, male    DOB: Jan 07, 1951, 63 y.o.   MRN: [id number]  HPI  63 year old white male with history of rectal bleeding, iron deficiency anemia hyperlipidemia for followup. Patient has multiple complaints. Patient reports over the last 3 weeks she has experienced significant intermittent dyspnea with exertion. Patient reports he feels like he can't walk uphill without feeling short of breath. He also complains of his legs feeling weak. He denies any pain or weakness in his shoulders.  He denies any associated chest pain or chest pressure. He denies any palpitations. Denies any leg swelling or orthopnea.  Patient also complains of pain in his left hand. His left middle finger occasionally locks after gripping objects.  Internal hemorrhoids - rectal bleeding much better after regular use of psyllium fiber supplement.  Review of Systems Negative for chest pain,  Normal appetite, no weight change    Past Medical History  Diagnosis Date  . Hyperlipidemia   . Obesity   . Colon polyps     hx of  . GERD (gastroesophageal reflux disease)   . Iron deficiency anemia     Last EGD and colonoscopy performed by [facility]  . Barrett's esophagus   . Diverticulosis     History   Social History  . Marital Status: Married    Spouse Name: N/A    Number of Children: 3  . Years of Education: N/A   Occupational History  . 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 Smoking status: Former Smoker    Types: Cigarettes    Quit date: 06/10/1969  . Smokeless tobacco: Never Used  . Alcohol Use: Yes     Comment: < than 1 per day  . Drug Use: No  . Sexual Activity: Not on file   Other Topics Concern  . Not on file   Social History Narrative   Regular exercise- No    Past Surgical History  Procedure Laterality Date  . Edg  2004    Dr Amedeo Plenty- hiatal hernia  . Band hemorrhoidectomy    . Tonsillectomy       Family History  Problem Relation Age of Onset  . Lung cancer Father   . Cancer Father     lung  . Crohn's disease Sister   . Diabetes Cousin   . Heart disease Maternal Uncle     Allergies  Allergen Reactions  . Crestor [Rosuvastatin]     REACTION: Myositis    No current outpatient prescriptions on file prior to visit.   No current facility-administered medications on file prior to visit.    BP 140/88  Temp(Src) 98.3 F (36.8 C) (Oral)  Ht 5\' 9"  (1.753 m)  Wt 248 lb (112.492 kg)  BMI 36.61 kg/m2    Objective:   Physical Exam  Constitutional: He is oriented to person, place, and time. He appears well-developed and well-nourished. No distress.  HENT:  Head: Normocephalic and atraumatic.  Right Ear: External ear normal.  Left Ear: External ear normal.  Mouth/Throat: Oropharynx is clear and moist.  Crowded oropharynx  Eyes: Conjunctivae and EOM are normal. Pupils are equal, round, and reactive to light.  Neck: Neck supple.  No carotid bruit  Cardiovascular: Normal rate and normal heart sounds.   No murmur heard. Pulmonary/Chest: Effort normal and breath sounds normal. He has no wheezes.  Abdominal: Soft. Bowel sounds are normal. He exhibits no mass. There is  no tenderness.  Abdominal obesity  Musculoskeletal: He exhibits no edema.  No calf tenderness,  No muscle tenderness  Neurological: He is alert and oriented to person, place, and time. He has normal reflexes. No cranial nerve deficit. He exhibits normal muscle tone.  Patient able to perform 5 squats without difficulty  Skin: Skin is warm and dry.  Psychiatric: He has a normal mood and affect. His behavior is normal.          Assessment & Plan:

## 2013-07-19 NOTE — Assessment & Plan Note (Signed)
Patient experiencing pain and symptoms of trigger finger left middle finger. He has tenderness along the middle finger tendon. Refer to hand specialist for further evaluation and treatment.

## 2013-07-20 LAB — LDL CHOLESTEROL, DIRECT: Direct LDL: 133 mg/dL

## 2013-07-20 LAB — D-DIMER, QUANTITATIVE: D-Dimer, Quant: 0.28 ug/mL-FEU (ref 0.00–0.48)

## 2013-07-21 ENCOUNTER — Other Ambulatory Visit: Payer: Self-pay | Admitting: Internal Medicine

## 2013-07-21 DIAGNOSIS — D509 Iron deficiency anemia, unspecified: Secondary | ICD-10-CM

## 2013-07-22 ENCOUNTER — Telehealth: Payer: Self-pay | Admitting: Hematology & Oncology

## 2013-07-22 NOTE — Telephone Encounter (Signed)
Gabrialle from referring [phone] ext [phone] called said put in referral. Referral is in wrong I tried to change it but am locked out. I left her message to change

## 2013-07-29 ENCOUNTER — Other Ambulatory Visit: Payer: BC Managed Care – PPO

## 2013-07-29 ENCOUNTER — Other Ambulatory Visit: Payer: Self-pay | Admitting: Internal Medicine

## 2013-07-29 DIAGNOSIS — K921 Melena: Secondary | ICD-10-CM

## 2013-07-29 LAB — FECAL OCCULT BLOOD, IMMUNOCHEMICAL: Fecal Occult Bld: POSITIVE — AB

## 2013-07-30 ENCOUNTER — Other Ambulatory Visit: Payer: Self-pay | Admitting: Internal Medicine

## 2013-07-30 DIAGNOSIS — K625 Hemorrhage of anus and rectum: Secondary | ICD-10-CM

## 2013-08-03 ENCOUNTER — Encounter: Payer: Self-pay | Admitting: Gastroenterology

## 2013-08-03 ENCOUNTER — Ambulatory Visit (INDEPENDENT_AMBULATORY_CARE_PROVIDER_SITE_OTHER): Payer: BC Managed Care – PPO | Admitting: Gastroenterology

## 2013-08-03 VITALS — BP 120/66 | HR 64 | Ht 70.0 in | Wt 247.2 lb

## 2013-08-03 DIAGNOSIS — K644 Residual hemorrhoidal skin tags: Secondary | ICD-10-CM

## 2013-08-03 DIAGNOSIS — D509 Iron deficiency anemia, unspecified: Secondary | ICD-10-CM

## 2013-08-03 NOTE — Progress Notes (Signed)
Review of pertinent gastrointestinal problems: 1. Adenomatous polyps:Colonoscopy Dr. Amedeo Plenty 2004. Small polyp, also diverticulosis pathology report shows TA was removed. He also did EGD, duodenum biopsied and it was normal (no villous atrophy). Described a 2-3cm HH; Colonoscopy Dr. Harrell Lark: pathology report from 10/2008: "tubular adenoma without HGD";  Colonoscopy Ardis Hughs 10/2012 found internal and external hemorrhoids, diverticulum; also 1mm polyp (TA on pathology) recommended recall at 5 years 2. EGD report 10/2008, Dr. Claretta Fraise; done for IDA; found "duodentitis, gastritis, esophagitis" Pathology showed no barrett's  3. Bleeding hemorrhoids: 2014: nearly daily bleeding; hemorrhoids noted on colonoscopy 2014; patient evaluated by Dr. Leighton Ruff and offered hemorrhoidal ligation.   HPI: This is a  very pleasant 63 year old man whom I last saw about a year ago.  He went to see Dr. Marcello Moores last year for hemorrhoids.  He opted not for surgery, has been doing citrucel fairly regularly. Will be off for a month at a time, restarted recently.  He had "slightly pink blood." today, which is good for himi.  Restarted citrucel 2 weeks ago, prior to that would have dripping blood into the toilet and overt red bleeding with every BM.   He has not been taking iron supplements at all.  Has been years since he's been taking iron orally. Tells me He couldn't find correct dosage.  No black stools, no UGI symptoms.  Weight is up 8 pounds since last year   Bloodwork 07/2013: Hb 8.3, mcv 60; FOBT +     Past Medical History  Diagnosis Date  . Hyperlipidemia   . Obesity   . Colon polyps     hx of  . GERD (gastroesophageal reflux disease)   . Diverticulosis   . Iron deficiency anemia     presumed from hemorrhoids which bleed daily (GI consult 2014)    Past Surgical History  Procedure Laterality Date  . Edg  2004    Dr Amedeo Plenty- hiatal hernia  . Band hemorrhoidectomy    . Tonsillectomy       Current Outpatient Prescriptions  Medication Sig Dispense Refill  . Methylcellulose, Laxative, (CITRUCEL) 500 MG TABS Take 1 tablet by mouth daily.       No current facility-administered medications for this visit.    Allergies as of 08/03/2013 - Review Complete 08/03/2013  Allergen Reaction Noted  . Crestor [rosuvastatin]  09/19/2008    Family History  Problem Relation Age of Onset  . Lung cancer Father   . Cancer Father     lung  . Crohn's disease Sister   . Diabetes Cousin   . Heart disease Maternal Uncle     History   Social History  . Marital Status: Married    Spouse Name: N/A    Number of Children: 3  . Years of Education: N/A   Occupational History  . 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 Smoking status: Former Smoker    Types: Cigarettes    Quit date: 06/10/1969  . Smokeless tobacco: Never Used  . Alcohol Use: Yes     Comment: < than 1 per day  . Drug Use: No  . Sexual Activity: Not on file   Other Topics Concern  . Not on file   Social History Narrative   Regular exercise- No      Physical Exam: BP 120/66  Pulse 64  Ht 5\' 10"  (1.778 m)  Wt 247 lb 3.2 oz (112.129 kg)  BMI 35.47 kg/m2  Constitutional: generally well-appearing Psychiatric: alert and oriented x3 Abdomen: soft, nontender, nondistended, no obvious ascites, no peritoneal signs, normal bowel sounds Rectal examination: Multiple medium sized fairly swollen and external anal hemorrhoids with a small bit of bright red blood present   Assessment and plan: 63 y.o. male with iron deficiency anemia  If he does not take fiber supplements daily he has overt red bleeding with every bowel movement 1-2 times a day including dripping of blood in the toilet. He had not been taking fiber supplements up until about 2 weeks ago and so for the past at least month a half he has been having that bleeding. He seems to downplay his bleeding while on Citrucel and  even admits today he had some pink blood with his BM. He does not regularly take Citrucel on months reminded. He has never been or at least not for many years been on iron supplements. He tells me he cannot find correct dosage. I think that the medium to large size external anal hemorrhoids are likely the cause of his iron deficiency anemia. I recommended to him that he should stay on Citrucel on a daily basis and if he continues to see any overt bleeding he should reconsider hemorrhoidal ligation that was offered to him by surgeon last year. I also recommended that he take iron supplements orally on a daily basis. He should start with 325 mg once daily. I do not think any further endoscopic workup needs to be done. He has had colonoscopy in the past year, he has had 2 upper endoscopies in the past 10 years including the last one about 5 years ago and [place]. He has no upper GI symptoms. He has gained 8 pounds in the last year I think it is very unlikely he has any occult malignancy. He will return to see me in 8 weeks with CBC, iron studies done the day prior.

## 2013-08-03 NOTE — Patient Instructions (Signed)
Your hemorrhoids are likely the cause of your iron deficiency anemia. You need to start OTC iron supplement (one 325 mg pill every single day). You should stay on daily fiber supplement. Please return to see Dr. Ardis Hughs in 8 weeks; labs (cbc, ferritin, serum iron) the day prior.

## 2013-08-11 ENCOUNTER — Other Ambulatory Visit (HOSPITAL_COMMUNITY): Payer: BC Managed Care – PPO

## 2013-08-18 ENCOUNTER — Ambulatory Visit: Payer: BC Managed Care – PPO | Admitting: Internal Medicine

## 2013-08-18 ENCOUNTER — Other Ambulatory Visit (HOSPITAL_COMMUNITY): Payer: BC Managed Care – PPO

## 2013-08-23 ENCOUNTER — Other Ambulatory Visit (HOSPITAL_COMMUNITY): Payer: BC Managed Care – PPO

## 2013-08-26 ENCOUNTER — Telehealth: Payer: Self-pay | Admitting: Hematology & Oncology

## 2013-08-26 NOTE — Telephone Encounter (Signed)
Spoke w NEW PATIENT today to remind them of their appointment with Dr. Ennever. Also, advised them to bring all meds and insurance information. ° °

## 2013-08-27 ENCOUNTER — Ambulatory Visit (HOSPITAL_BASED_OUTPATIENT_CLINIC_OR_DEPARTMENT_OTHER): Payer: BC Managed Care – PPO | Admitting: Hematology & Oncology

## 2013-08-27 ENCOUNTER — Other Ambulatory Visit (HOSPITAL_BASED_OUTPATIENT_CLINIC_OR_DEPARTMENT_OTHER): Payer: BC Managed Care – PPO | Admitting: Lab

## 2013-08-27 ENCOUNTER — Ambulatory Visit: Payer: BC Managed Care – PPO

## 2013-08-27 ENCOUNTER — Ambulatory Visit (HOSPITAL_BASED_OUTPATIENT_CLINIC_OR_DEPARTMENT_OTHER): Payer: BC Managed Care – PPO

## 2013-08-27 VITALS — BP 148/92 | HR 65 | Temp 96.9°F | Wt 244.0 lb

## 2013-08-27 DIAGNOSIS — D509 Iron deficiency anemia, unspecified: Secondary | ICD-10-CM

## 2013-08-27 LAB — CMP (CANCER CENTER ONLY)
ALK PHOS: 48 U/L (ref 26–84)
ALT: 19 U/L (ref 10–47)
AST: 17 U/L (ref 11–38)
Albumin: 4.2 g/dL (ref 3.3–5.5)
BILIRUBIN TOTAL: 0.7 mg/dL (ref 0.20–1.60)
BUN, Bld: 11 mg/dL (ref 7–22)
CO2: 28 mEq/L (ref 18–33)
Calcium: 9.2 mg/dL (ref 8.0–10.3)
Chloride: 106 mEq/L (ref 98–108)
Creat: 0.9 mg/dl (ref 0.6–1.2)
Glucose, Bld: 83 mg/dL (ref 73–118)
Potassium: 3.9 mEq/L (ref 3.3–4.7)
SODIUM: 138 meq/L (ref 128–145)
TOTAL PROTEIN: 7.4 g/dL (ref 6.4–8.1)

## 2013-08-27 LAB — CBC WITH DIFFERENTIAL (CANCER CENTER ONLY)
BASO#: 0.1 10*3/uL (ref 0.0–0.2)
BASO%: 0.7 % (ref 0.0–2.0)
EOS%: 3.8 % (ref 0.0–7.0)
Eosinophils Absolute: 0.4 10*3/uL (ref 0.0–0.5)
HCT: 33.4 % — ABNORMAL LOW (ref 38.7–49.9)
HGB: 9.1 g/dL — ABNORMAL LOW (ref 13.0–17.1)
LYMPH#: 2.2 10*3/uL (ref 0.9–3.3)
LYMPH%: 20.3 % (ref 14.0–48.0)
MCH: 16.9 pg — AB (ref 28.0–33.4)
MCHC: 27.2 g/dL — ABNORMAL LOW (ref 32.0–35.9)
MCV: 62 fL — AB (ref 82–98)
MONO#: 1.2 10*3/uL — ABNORMAL HIGH (ref 0.1–0.9)
MONO%: 11.2 % (ref 0.0–13.0)
NEUT#: 6.9 10*3/uL — ABNORMAL HIGH (ref 1.5–6.5)
NEUT%: 64 % (ref 40.0–80.0)
PLATELETS: 518 10*3/uL — AB (ref 145–400)
RBC: 5.4 10*6/uL (ref 4.20–5.70)
RDW: 19.7 % — ABNORMAL HIGH (ref 11.1–15.7)
WBC: 10.7 10*3/uL — ABNORMAL HIGH (ref 4.0–10.0)

## 2013-08-27 LAB — CHCC SATELLITE - SMEAR

## 2013-08-27 MED ORDER — FERUMOXYTOL INJECTION 510 MG/17 ML
1020.0000 mg | Freq: Once | INTRAVENOUS | Status: AC
  Administered 2013-08-27: 1020 mg via INTRAVENOUS
  Filled 2013-08-27: qty 34

## 2013-08-27 MED ORDER — SODIUM CHLORIDE 0.9 % IV SOLN
Freq: Once | INTRAVENOUS | Status: AC
  Administered 2013-08-27: 14:00:00 via INTRAVENOUS

## 2013-08-27 NOTE — Progress Notes (Signed)
Referral MD  Reason for Referral: Iron deficiency secondary to GI blood loss   Chief Complaint  Patient presents with  . Follow-up  : I'm here because my blood is low.  HPI: Micheal Hamilton is a 63 year old gentleman. He's been in good health. He has had problems with hemorrhoids. This been going on for quite a while. He has had evaluation by gastroenterology. He's been endoscoped. He has had a band hemorrhoidectomy.  He's been on oral iron. This causes constipation.  He's had no bleeding otherwise. He's not taking aspirin or nonsteroidal medications.   The last iron studies that I have on him her back in January 2014. His ferritin was only 3. Iron saturation was 4%.  He does chew ice a lot. He's had no issues with weight loss weight gain. He's had no rashes. He's had no cough or shortness of breath. He's not noted any change in his bowel or bladder habits.  He was coming refer to the [facility] for an evaluation.  He's had no issues with his kidneys.  He is not a vegetarian. He does get tired quite easily. He does get fatigued. He says his muscles just give out on him.           Past Medical History  Diagnosis Date  . Hyperlipidemia   . Obesity   . Colon polyps     hx of  . GERD (gastroesophageal reflux disease)   . Diverticulosis   . Iron deficiency anemia     presumed from hemorrhoids which bleed daily (GI consult 2014)  :  Past Surgical History  Procedure Laterality Date  . Edg  2004    Dr Amedeo Plenty- hiatal hernia  . Band hemorrhoidectomy    . Tonsillectomy    :  Current outpatient prescriptions:ferrous fumarate (HEMOCYTE - 106 MG FE) 325 (106 FE) MG TABS tablet, Take 45 mg of iron by mouth daily., Disp: , Rfl: ;  Methylcellulose, Laxative, (CITRUCEL) 500 MG TABS, Take 1 tablet by mouth daily., Disp: , Rfl: ;  naproxen sodium (ANAPROX) 220 MG tablet, Take 220 mg by mouth as needed., Disp: , Rfl: :  :  Allergies  Allergen Reactions  . Crestor  [Rosuvastatin]     REACTION: Myositis  :  Family History  Problem Relation Age of Onset  . Lung cancer Father   . Cancer Father     lung  . Crohn's disease Sister   . Diabetes Cousin   . Heart disease Maternal Uncle   :  History   Social History  . Marital Status: Married    Spouse Name: N/A    Number of Children: 3  . Years of Education: N/A   Occupational History  . 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 Smoking status: Former Smoker    Types: Cigarettes    Quit date: 06/10/1969  . Smokeless tobacco: Never Used  . Alcohol Use: Yes     Comment: < than 1 per day  . Drug Use: No  . Sexual Activity: Not on file   Other Topics Concern  . Not on file   Social History Narrative   Regular exercise- No  :  Pertinent items are noted in HPI.  Exam: _0 @  well-developed well-nourished gentleman. He is in no obvious distress. His vital signs show a temperature of 96.9. Pulse 65. Blood pressure 148/92. Weight is 244 pounds. Head and neck exam shows pale conjunctiva. There is  no oral lesions. Tongue is not inflamed. No adenopathy in the neck. Thyroid is nonpalpable. Lungs are clear. Cardiac exam regular in rhythm with no murmurs rubs or bruits. Abdomen is soft. She has good bowel sounds. There is no fluid wave. There is a palpable hepato- splenomegaly exam no tenderness over the spine ribs or hips. Extremities shows no clubbing cyanosis or edema. Neurological exam no focal neurological deficits.   Recent Labs  08/27/13 1212  WBC 10.7*  HGB 9.1*  HCT 33.4*  PLT 518*    Recent Labs  08/27/13 1212  NA 138  K 3.9  CL 106  CO2 28  GLUCOSE 83  BUN 11  CREATININE 0.9  CALCIUM 9.2    Blood smear review: Moderate anisocytosis and poikilocytosis. He has microcytic red cells. I see no rouleau formation. There may be a couple target cells. I see no nucleated red cells. There are no schistocytes or spherocytes. White cells are  normal in morphology maturation. There is no immature myeloid lymphoid forms. I see no hyper segmented polys. Platelets are increased in number and size.  Pathology: None    ASSESSMENT:  Micheal Hamilton is a 63 year old gentleman. His microcytic anemia. This clearly is iron deficiency. Looks like this is from his hemorrhoids.  I don't see any obvious underlying hemoglobin issue. I don't think there is any thalassemia. His from [place].  Is on oral iron. This definitely is not working.  We will give him IV iron. I know that this will work.  We will give him a dose in the office today. He I will give him Feraheme at a dose of 1020 mg.  I do not see any indication for a bone marrow biopsy on him. I do not see that this will give [place] any further information.  I spent a good hour with him. I went over the lab work with him. I explained to him why I thought he needed intravenous iron. He understands. I told him that there is about a 1 or 2% chance of a side effect to the intravenous iron. He understands this and we will see.  Is clue possible he may need another dose of IV iron. This will really depend on his iron studies.  I will likely see him back in the office in about 6 weeks and we should be seen a nice improvement in his iron levels.

## 2013-08-27 NOTE — Patient Instructions (Signed)

## 2013-08-30 LAB — IRON AND TIBC CHCC
%SAT: 4 % — ABNORMAL LOW (ref 20–55)
Iron: 21 ug/dL — ABNORMAL LOW (ref 42–163)
TIBC: 479 ug/dL — ABNORMAL HIGH (ref 202–409)
UIBC: 458 ug/dL — ABNORMAL HIGH (ref 117–376)

## 2013-08-30 LAB — FERRITIN CHCC: FERRITIN: 5 ng/mL — AB (ref 22–316)

## 2013-08-31 LAB — HEMOGLOBINOPATHY EVALUATION
HGB A: 98.3 % — AB (ref 96.8–97.8)
HGB F QUANT: 0 % (ref 0.0–2.0)
Hemoglobin Other: 0 %
Hgb A2 Quant: 1.7 % — ABNORMAL LOW (ref 2.2–3.2)
Hgb S Quant: 0 %

## 2013-08-31 LAB — RETICULOCYTES (CHCC)
ABS RETIC: 146.9 10*3/uL (ref 19.0–186.0)
RBC.: 5.44 MIL/uL (ref 4.22–5.81)
RETIC CT PCT: 2.7 % — AB (ref 0.4–2.3)

## 2013-09-01 ENCOUNTER — Other Ambulatory Visit (HOSPITAL_COMMUNITY): Payer: BC Managed Care – PPO

## 2013-09-06 ENCOUNTER — Ambulatory Visit (HOSPITAL_COMMUNITY): Payer: BC Managed Care – PPO | Attending: Cardiology | Admitting: Radiology

## 2013-09-06 ENCOUNTER — Encounter: Payer: Self-pay | Admitting: Cardiology

## 2013-09-06 ENCOUNTER — Other Ambulatory Visit: Payer: Self-pay

## 2013-09-06 DIAGNOSIS — R0602 Shortness of breath: Secondary | ICD-10-CM | POA: Insufficient documentation

## 2013-09-06 NOTE — Progress Notes (Signed)
Echocardiogram performed.  

## 2013-09-20 ENCOUNTER — Ambulatory Visit (INDEPENDENT_AMBULATORY_CARE_PROVIDER_SITE_OTHER): Payer: BC Managed Care – PPO | Admitting: Internal Medicine

## 2013-09-20 ENCOUNTER — Ambulatory Visit (INDEPENDENT_AMBULATORY_CARE_PROVIDER_SITE_OTHER)
Admission: RE | Admit: 2013-09-20 | Discharge: 2013-09-20 | Disposition: A | Discharge status: Home / Self Care | Payer: BC Managed Care – PPO | Source: Ambulatory Visit | Attending: Internal Medicine | Admitting: Internal Medicine

## 2013-09-20 ENCOUNTER — Encounter: Payer: Self-pay | Admitting: Internal Medicine

## 2013-09-20 VITALS — BP 118/90 | HR 80 | Temp 98.0°F | Ht 70.0 in | Wt 240.0 lb

## 2013-09-20 DIAGNOSIS — D509 Iron deficiency anemia, unspecified: Secondary | ICD-10-CM

## 2013-09-20 DIAGNOSIS — M25569 Pain in unspecified knee: Secondary | ICD-10-CM

## 2013-09-20 DIAGNOSIS — M542 Cervicalgia: Secondary | ICD-10-CM

## 2013-09-20 DIAGNOSIS — L259 Unspecified contact dermatitis, unspecified cause: Secondary | ICD-10-CM

## 2013-09-20 DIAGNOSIS — M25562 Pain in left knee: Secondary | ICD-10-CM

## 2013-09-20 LAB — CBC WITH DIFFERENTIAL/PLATELET
BASOS ABS: 0.1 10*3/uL (ref 0.0–0.1)
Basophils Relative: 0.8 % (ref 0.0–3.0)
Eosinophils Absolute: 0.2 10*3/uL (ref 0.0–0.7)
Eosinophils Relative: 2.3 % (ref 0.0–5.0)
HCT: 41.4 % (ref 39.0–52.0)
Hemoglobin: 12.9 g/dL — ABNORMAL LOW (ref 13.0–17.0)
Lymphocytes Relative: 23.1 % (ref 12.0–46.0)
Lymphs Abs: 1.8 10*3/uL (ref 0.7–4.0)
MCHC: 31 g/dL (ref 30.0–36.0)
MONO ABS: 0.9 10*3/uL (ref 0.1–1.0)
Monocytes Relative: 11.7 % (ref 3.0–12.0)
Neutro Abs: 4.7 10*3/uL (ref 1.4–7.7)
Neutrophils Relative %: 62.1 % (ref 43.0–77.0)
PLATELETS: 387 10*3/uL (ref 150.0–400.0)
RBC: 6.03 Mil/uL — ABNORMAL HIGH (ref 4.22–5.81)
RDW: 34.5 % — ABNORMAL HIGH (ref 11.5–14.6)
WBC: 7.6 10*3/uL (ref 4.5–10.5)

## 2013-09-20 LAB — BASIC METABOLIC PANEL
BUN: 14 mg/dL (ref 6–23)
CO2: 27 mEq/L (ref 19–32)
Calcium: 9.6 mg/dL (ref 8.4–10.5)
Chloride: 102 mEq/L (ref 96–112)
Creatinine, Ser: 0.9 mg/dL (ref 0.4–1.5)
GFR: 95.7 mL/min (ref >60.00)
Glucose, Bld: 108 mg/dL — ABNORMAL HIGH (ref 70–99)
Potassium: 3.7 mEq/L (ref 3.5–5.1)
SODIUM: 137 meq/L (ref 135–145)

## 2013-09-20 LAB — URIC ACID: Uric Acid, Serum: 6.8 mg/dL (ref 4.0–7.8)

## 2013-09-20 MED ORDER — PREDNISONE 20 MG PO TABS
20.0000 mg | ORAL_TABLET | Freq: Every day | ORAL | Status: DC
Start: 2013-09-20 — End: 2013-10-08

## 2013-09-20 MED ORDER — TRAMADOL HCL 50 MG PO TABS: 50.0000 mg | ORAL_TABLET | Freq: Three times a day (TID) | ORAL | Status: DC | PRN

## 2013-09-20 MED ORDER — CLOBETASOL PROPIONATE 0.05 % EX CREA: 1.0000 "application " | TOPICAL_CREAM | Freq: Two times a day (BID) | CUTANEOUS | Status: DC

## 2013-09-20 NOTE — Assessment & Plan Note (Addendum)
Patient complains of chronic left knee pain. He does not have joint effusion or joint redness. I suspect he has degenerative joint disease. He may also have meniscal tear.  Obtain knee xray.  Check CBCD and uric acid level.  Avoid NSAIDs considering hx of rectal bleeding.  Use tramadol 50 mg and Acetaminophen 325 mg as directed.

## 2013-09-20 NOTE — Progress Notes (Signed)
Pre visit review using our clinic review tool, if applicable. No additional management support is needed unless otherwise documented below in the visit note. 

## 2013-09-20 NOTE — Progress Notes (Signed)
Subjective:    Patient ID: Micheal Hamilton, male    DOB: 05/10/1951, 63 y.o.   MRN: [id number]  HPI  63 year old white male previously seen for significant dyspnea with exertion for followup. Patient found to have significant worsening anemia. He was seen by gastroenterologist who felt his anemia secondary to persistent rectal bleeding. Patient treated for internal hemorrhoids the bleeding significantly improved.  Patient been seen by hematology for iron deficiency. He was not absorbing oral iron. He is feeling much better after receiving his first dose of IV iron.  Patient also complains of exposure to poison ivy. He has had severe reactions in the past. He has small area underneath left arm that is slightly red and pruritic.  Patient also complains of left knee pain. No history of trauma or injury. The symptoms worse with activity. He denies any joint redness or swelling.  Magda Paganini complains of neck pain.  He fell approximately one month ago and ever since then his neck has been stiff. He denies any radiation of pain to his arms.   Review of Systems Negative for weakness, shortness of breath improved.  He still has mild fatigue    Past Medical History  Diagnosis Date  . Hyperlipidemia   . Obesity   . Colon polyps     hx of  . GERD (gastroesophageal reflux disease)   . Diverticulosis   . Iron deficiency anemia     presumed from hemorrhoids which bleed daily (GI consult 2014)    History   Social History  . Marital Status: Married    Spouse Name: N/A    Number of Children: 3  . Years of Education: N/A   Occupational History  . 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 Smoking status: Former Smoker    Types: Cigarettes    Quit date: 06/10/1969  . Smokeless tobacco: Never Used  . Alcohol Use: Yes     Comment: < than 1 per day  . Drug Use: No  . Sexual Activity: Not on file   Other Topics Concern  . Not on file   Social History  Narrative   Regular exercise- No    Past Surgical History  Procedure Laterality Date  . Edg  2004    Dr Amedeo Plenty- hiatal hernia  . Band hemorrhoidectomy    . Tonsillectomy      Family History  Problem Relation Age of Onset  . Lung cancer Father   . Cancer Father     lung  . Crohn's disease Sister   . Diabetes Cousin   . Heart disease Maternal Uncle     Allergies  Allergen Reactions  . Crestor [Rosuvastatin]     REACTION: Myositis    No current outpatient prescriptions on file prior to visit.   No current facility-administered medications on file prior to visit.    BP 118/90  Pulse 80  Temp(Src) 98 F (36.7 C) (Oral)  Ht 5\' 10"  (1.778 m)  Wt 240 lb (108.863 kg)  BMI 34.44 kg/m2    Objective:   Physical Exam  Constitutional: He is oriented to person, place, and time. He appears well-developed and well-nourished. No distress.  HENT:  Head: Normocephalic and atraumatic.  Eyes: Conjunctivae and EOM are normal. Pupils are equal, round, and reactive to light.  Cardiovascular: Normal rate, regular rhythm and normal heart sounds.   No murmur heard. Pulmonary/Chest: Effort normal and breath sounds normal. He has no wheezes.  Musculoskeletal: He exhibits no edema.  Left knee joint is stable, mild medial joint line tenderness, no redness or swelling  Neurological: He is alert and oriented to person, place, and time. No cranial nerve deficit.  Skin:  Faint erythematous eruption underneath left upper arm  Psychiatric: He has a normal mood and affect. His behavior is normal.          Assessment & Plan:

## 2013-09-20 NOTE — Assessment & Plan Note (Signed)
Patient reports suffering fall approximately one month ago. He has chronic back pain.  He has decrease in range of motion. He declines cervical x-ray. He would like to try conservative measures. He agrees to proceed with additional studies if symptoms do not improve.

## 2013-09-20 NOTE — Assessment & Plan Note (Signed)
Patient now followed by hematologist. He was absorbing oral iron. He has a good response to IV iron.

## 2013-09-20 NOTE — Assessment & Plan Note (Signed)
Patient recently exposed to poison ivy. He has history of severe reactions in the past. He has small affected area underneath left upper arm currently. Patient advised to use clobetasol cream twice a day for a week as directed. If patient develops additional affected areas with worsening severity, patient advised to use prednisone as directed.

## 2013-09-29 ENCOUNTER — Ambulatory Visit: Payer: BC Managed Care – PPO | Admitting: Gastroenterology

## 2013-09-29 ENCOUNTER — Ambulatory Visit (INDEPENDENT_AMBULATORY_CARE_PROVIDER_SITE_OTHER): Payer: BC Managed Care – PPO | Admitting: Internal Medicine

## 2013-09-29 ENCOUNTER — Encounter: Payer: Self-pay | Admitting: Internal Medicine

## 2013-09-29 VITALS — BP 128/80 | Temp 97.9°F | Ht 70.0 in | Wt 238.0 lb

## 2013-09-29 DIAGNOSIS — M25562 Pain in left knee: Secondary | ICD-10-CM

## 2013-09-29 DIAGNOSIS — M25569 Pain in unspecified knee: Secondary | ICD-10-CM

## 2013-09-29 NOTE — Patient Instructions (Signed)
Elevate your left leg.  Use ACE bandage and ice pack as directed twice daily Avoid prolonged walking, heaving lifting, or other strenuous activity for 2 weeks Please contact our office if your symptoms do not improve or gets worse.

## 2013-09-29 NOTE — Assessment & Plan Note (Signed)
Patient's CBC with differential and uric acid was unremarkable. Knee x-ray showed mild osteoarthritis. He has had exacerbation of pain with prolonged walking. We discussed the risks and benefits of joint injection with cortisone. Patient agreed to proceed. Utilized aseptic technique to inject a mixture of 1% lidocaine and 80 mg of Depo-Medrol (2 mL) - anterior lateral to patellar tendon approach.  No complications.  Aftercare discussed.  If persistent or worsening left knee pain, we discussed referral to orthopedic specialist and MRI of left knee.
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
## 2013-09-29 NOTE — Progress Notes (Signed)
Pre visit review using our clinic review tool, if applicable. No additional management support is needed unless otherwise documented below in the visit note. 

## 2013-10-08 ENCOUNTER — Ambulatory Visit (HOSPITAL_BASED_OUTPATIENT_CLINIC_OR_DEPARTMENT_OTHER): Payer: BC Managed Care – PPO | Admitting: Hematology & Oncology

## 2013-10-08 ENCOUNTER — Encounter: Payer: Self-pay | Admitting: Hematology & Oncology

## 2013-10-08 ENCOUNTER — Other Ambulatory Visit (HOSPITAL_BASED_OUTPATIENT_CLINIC_OR_DEPARTMENT_OTHER): Payer: BC Managed Care – PPO | Admitting: Lab

## 2013-10-08 VITALS — BP 119/84 | HR 72 | Temp 97.9°F | Resp 18 | Ht 70.0 in | Wt 237.0 lb

## 2013-10-08 DIAGNOSIS — D509 Iron deficiency anemia, unspecified: Secondary | ICD-10-CM

## 2013-10-08 LAB — CBC WITH DIFFERENTIAL (CANCER CENTER ONLY)
BASO#: 0 10*3/uL (ref 0.0–0.2)
BASO%: 0.5 % (ref 0.0–2.0)
EOS ABS: 0.1 10*3/uL (ref 0.0–0.5)
EOS%: 1.5 % (ref 0.0–7.0)
HCT: 43.1 % (ref 38.7–49.9)
HGB: 13.7 g/dL (ref 13.0–17.1)
LYMPH#: 2.3 10*3/uL (ref 0.9–3.3)
LYMPH%: 26.3 % (ref 14.0–48.0)
MCH: 22.8 pg — AB (ref 28.0–33.4)
MCHC: 31.8 g/dL — ABNORMAL LOW (ref 32.0–35.9)
MCV: 72 fL — ABNORMAL LOW (ref 82–98)
MONO#: 0.9 10*3/uL (ref 0.1–0.9)
MONO%: 10.1 % (ref 0.0–13.0)
NEUT#: 5.3 10*3/uL (ref 1.5–6.5)
NEUT%: 61.6 % (ref 40.0–80.0)
PLATELETS: 369 10*3/uL (ref 145–400)
RBC: 6.02 10*6/uL — AB (ref 4.20–5.70)
RDW: 27.4 % — ABNORMAL HIGH (ref 11.1–15.7)
WBC: 8.6 10*3/uL (ref 4.0–10.0)

## 2013-10-08 LAB — IRON AND TIBC CHCC
%SAT: 9 % — ABNORMAL LOW (ref 20–55)
IRON: 33 ug/dL — AB (ref 42–163)
TIBC: 381 ug/dL (ref 202–409)
UIBC: 348 ug/dL (ref 117–376)

## 2013-10-08 LAB — FERRITIN CHCC: FERRITIN: 17 ng/mL — AB (ref 22–316)

## 2013-10-08 LAB — RETICULOCYTES (CHCC)
ABS Retic: 49.2 10*3/uL (ref 19.0–186.0)
RBC.: 6.15 MIL/uL — ABNORMAL HIGH (ref 4.22–5.81)
Retic Ct Pct: 0.8 % (ref 0.4–2.3)

## 2013-10-09 NOTE — Progress Notes (Signed)
Hematology and Oncology Follow Up Visit  Micheal Hamilton [id number] Nov 07, 1950 63 y.o. 10/09/2013   Principle Diagnosis:   Iron deficiency anemia  History of GI bleeding  Current Therapy:    IV iron with Feraheme     Interim History:  Mr.  Hamilton is back for followup. First saw him in mid March. At that point in time, his ferritin was only 5. Iron saturation was 4%. We did go ahead and give him a dose of Feraheme. He said he began to feel better almost immediately. He continues to feel well.  He's had no obvious GI blood loss. He has had hemorrhoid issues.  He's had no fever. He's had no cough. He's had no rashes. He's had no nausea vomiting.  He continues have some problems with his knees. He is trying to avoid surgery for this.  Medications: Current outpatient prescriptions:Misc Natural Products (FIBER 7) POWD, Take by mouth every morning., Disp: , Rfl: ;  traMADol (ULTRAM) 50 MG tablet, Take 50 mg by mouth as needed., Disp: , Rfl:   Allergies:  Allergies  Allergen Reactions  . Crestor [Rosuvastatin]     REACTION: Myositis    Past Medical History, Surgical history, Social history, and Family History were reviewed and updated.  Review of Systems: As above  Physical Exam:  height is 5\' 10"  (1.778 m) and weight is 237 lb (107.502 kg). His oral temperature is 97.9 F (36.6 C). His blood pressure is 119/84 and his pulse is 72. His respiration is 18.   Well-developed and well-nourished. Lungs are clear. Cardiac exam regular rate and rhythm with no murmurs rubs or bruits abdomen soft. Active bowel sounds. Is no fluid wave. There is no palpable liver or spleen tip. Back exam no tenderness over the spine ribs or hips. Skin exam shows no rashes ecchymosis or petechia. Neurological exam is nonfocal. Head and neck exam shows no adenopathy in the neck. There is no scleral icterus.  Lab Results  Component Value Date   WBC 8.6 10/08/2013   HGB 13.7 10/08/2013   HCT 43.1 10/08/2013   MCV 72* 10/08/2013   PLT 369 10/08/2013     Chemistry      Component Value Date/Time   NA 137 09/20/2013 1434   NA 138 08/27/2013 1212   K 3.7 09/20/2013 1434   K 3.9 08/27/2013 1212   CL 102 09/20/2013 1434   CL 106 08/27/2013 1212   CO2 27 09/20/2013 1434   CO2 28 08/27/2013 1212   BUN 14 09/20/2013 1434   BUN 11 08/27/2013 1212   CREATININE 0.9 09/20/2013 1434   CREATININE 0.9 08/27/2013 1212      Component Value Date/Time   CALCIUM 9.6 09/20/2013 1434   CALCIUM 9.2 08/27/2013 1212   ALKPHOS 48 08/27/2013 1212   ALKPHOS 47 07/19/2013 1506   AST 17 08/27/2013 1212   AST 21 07/19/2013 1506   ALT 19 08/27/2013 1212   ALT 16 07/19/2013 1506   BILITOT 0.70 08/27/2013 1212   BILITOT 0.4 07/19/2013 1506      His ferritin is 17. Iron saturation is 9%. Total iron is 33.   Impression and Plan: Micheal Hamilton is 63 year old gentleman. He is profoundly iron deficient. We did give him one dose of IV iron.  He would will have to give him another dose of IV iron. This worked well. I Hamilton to make sure that we replenish his iron stores.  We will call him to come in for iron. Otherwise, I  don't think I have to see him back unless he feels that he needs to have his blood checked.  I looked at his blood under the microscope. He still has a microcytic red cells. His MCV is only 72. As such, I still think that he can use in benefit from more supplemental iron.   Volanda Napoleon, MD 5/2/20157:43 AM

## 2013-10-11 ENCOUNTER — Telehealth: Payer: Self-pay | Admitting: Hematology & Oncology

## 2013-10-11 ENCOUNTER — Telehealth: Payer: Self-pay | Admitting: Internal Medicine

## 2013-10-11 DIAGNOSIS — M25562 Pain in left knee: Secondary | ICD-10-CM

## 2013-10-11 NOTE — Telephone Encounter (Signed)
Referral order placed.

## 2013-10-11 NOTE — Telephone Encounter (Signed)
Left pt message to call for appointment °

## 2013-10-11 NOTE — Telephone Encounter (Signed)
Pt would like asap referral [facility] for severe knee pain, or [facility], whichever dr Shawna Orleans prefers. Pt would like appt today or Friday if possible.  Dr Shawna Orleans say pt 4/22 and pt not better

## 2013-10-11 NOTE — Telephone Encounter (Signed)
Ok for referral to [facility] re: knee pain.  Try to arrange referral this week.

## 2013-10-11 NOTE — Telephone Encounter (Signed)
Pt aware of 5-11 iron infusion

## 2013-10-18 ENCOUNTER — Ambulatory Visit (HOSPITAL_BASED_OUTPATIENT_CLINIC_OR_DEPARTMENT_OTHER): Payer: BC Managed Care – PPO

## 2013-10-18 VITALS — BP 120/79 | HR 78 | Temp 97.8°F | Resp 16

## 2013-10-18 DIAGNOSIS — D509 Iron deficiency anemia, unspecified: Secondary | ICD-10-CM

## 2013-10-18 MED ORDER — SODIUM CHLORIDE 0.9 % IV SOLN
INTRAVENOUS | Status: DC
  Administered 2013-10-18: 15:00:00 via INTRAVENOUS

## 2013-10-18 MED ORDER — SODIUM CHLORIDE 0.9 % IV SOLN
1020.0000 mg | Freq: Once | INTRAVENOUS | Status: AC
  Administered 2013-10-18: 1020 mg via INTRAVENOUS
  Filled 2013-10-18: qty 34

## 2013-10-18 NOTE — Patient Instructions (Signed)

## 2014-09-01 ENCOUNTER — Other Ambulatory Visit: Payer: Self-pay | Admitting: Family

## 2015-02-27 ENCOUNTER — Ambulatory Visit (INDEPENDENT_AMBULATORY_CARE_PROVIDER_SITE_OTHER): Payer: Managed Care, Other (non HMO) | Admitting: Internal Medicine

## 2015-02-27 ENCOUNTER — Encounter: Payer: Self-pay | Admitting: Internal Medicine

## 2015-02-27 VITALS — BP 130/74 | HR 84 | Temp 98.1°F | Resp 20 | Ht 70.0 in | Wt 242.0 lb

## 2015-02-27 DIAGNOSIS — L309 Dermatitis, unspecified: Secondary | ICD-10-CM | POA: Diagnosis not present

## 2015-02-27 DIAGNOSIS — Z23 Encounter for immunization: Secondary | ICD-10-CM

## 2015-02-27 MED ORDER — TRIAMCINOLONE ACETONIDE 0.1 % EX CREA: 1.0000 "application " | TOPICAL_CREAM | Freq: Two times a day (BID) | CUTANEOUS | Status: DC

## 2015-02-27 NOTE — Progress Notes (Signed)
Subjective:    Patient ID: Micheal Hamilton, male    DOB: 1950-07-18, 64 y.o.   MRN: [id number]  HPI  64 year old patient who presents with a 1-2 month history of a dermatitis involving his left mid inner forearm.  It is slightly pruritic intermittently.  No other complaints.  No drug use except for rare naproxen.  No constitutional complaints  Past Medical History  Diagnosis Date  . Hyperlipidemia   . Obesity   . Colon polyps     hx of  . GERD (gastroesophageal reflux disease)   . Diverticulosis   . Iron deficiency anemia     presumed from hemorrhoids which bleed daily (GI consult 2014)    Social History   Social History  . Marital Status: Married    Spouse Name: N/A  . Number of Children: 3  . Years of Education: N/A   Occupational History  . 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 Smoking status: Former Smoker -- 2.50 packs/day for 25 years    Types: Cigarettes    Start date: 03/10/1964    Quit date: 06/10/1988  . Smokeless tobacco: Never Used     Comment: quit 25 years ago  . Alcohol Use: Yes     Comment: < than 1 per day  . Drug Use: No  . Sexual Activity: Not on file   Other Topics Concern  . Not on file   Social History Narrative   Regular exercise- No    Past Surgical History  Procedure Laterality Date  . Edg  2004    Dr Amedeo Plenty- hiatal hernia  . Band hemorrhoidectomy    . Tonsillectomy      Family History  Problem Relation Age of Onset  . Lung cancer Father   . Cancer Father     lung  . Crohn's disease Sister   . Diabetes Cousin   . Heart disease Maternal Uncle     Allergies  Allergen Reactions  . Crestor [Rosuvastatin]     REACTION: Myositis    Current Outpatient Prescriptions on File Prior to Visit  Medication Sig Dispense Refill  . Misc Natural Products (FIBER 7) POWD Take by mouth every morning.     No current facility-administered medications on file prior to visit.    BP 130/74 mmHg   Pulse 84  Temp(Src) 98.1 F (36.7 C) (Oral)  Resp 20  Ht 5\' 10"  (1.778 m)  Wt 242 lb (109.77 kg)  BMI 34.72 kg/m2  SpO2 98%     Review of Systems  Constitutional: Negative for fever, chills, appetite change and fatigue.  HENT: Negative for congestion, dental problem, ear pain, hearing loss, sore throat, tinnitus, trouble swallowing and voice change.   Eyes: Negative for pain, discharge and visual disturbance.  Respiratory: Negative for cough, chest tightness, wheezing and stridor.   Cardiovascular: Negative for chest pain, palpitations and leg swelling.  Gastrointestinal: Negative for nausea, vomiting, abdominal pain, diarrhea, constipation, blood in stool and abdominal distention.  Genitourinary: Negative for urgency, hematuria, flank pain, discharge, difficulty urinating and genital sores.  Musculoskeletal: Negative for myalgias, back pain, joint swelling, arthralgias, gait problem and neck stiffness.  Skin: Positive for rash.  Neurological: Negative for dizziness, syncope, speech difficulty, weakness, numbness and headaches.  Hematological: Negative for adenopathy. Does not bruise/bleed easily.  Psychiatric/Behavioral: Negative for behavioral problems and dysphoric mood. The patient is not nervous/anxious.        Objective:   Physical  Exam  Constitutional: He is oriented to person, place, and time. He appears well-developed.  HENT:  Head: Normocephalic.  Right Ear: External ear normal.  Left Ear: External ear normal.  Eyes: Conjunctivae and EOM are normal.  Neck: Normal range of motion.  No left axillary or left epitrochlear adenopathy  Cardiovascular: Normal rate and normal heart sounds.   Pulmonary/Chest: Breath sounds normal.  Abdominal: Bowel sounds are normal.  Musculoskeletal: Normal range of motion. He exhibits no edema or tenderness.  Neurological: He is alert and oriented to person, place, and time.  Skin:  4 cm erythematous slightly raised lesion left inner  mid forearm  Psychiatric: He has a normal mood and affect. His behavior is normal.          Assessment & Plan:   Nonspecific dermatitis, left forearm.  Will try a nonsedating antihistamine as well as triamcinolone cream.  He'll report any clinical change  Schedule CPX with PCP at his convenience

## 2015-02-27 NOTE — Addendum Note (Signed)
Addended by: Marian Sorrow on: 02/27/2015 08:52 AM   Modules accepted: Orders

## 2015-02-27 NOTE — Progress Notes (Signed)
Pre visit review using our clinic review tool, if applicable. No additional management support is needed unless otherwise documented below in the visit note. 

## 2015-02-27 NOTE — Patient Instructions (Signed)
Call or return to clinic prn if these symptoms worsen or fail to improve as anticipated. Take a nonsedating antihistamine such as Allegra or Zyrtec once daily  Triamcinolone cream as directed

## 2016-03-01 ENCOUNTER — Telehealth: Payer: Self-pay | Admitting: Internal Medicine

## 2016-03-01 NOTE — Telephone Encounter (Signed)
Pt said his wife dawn talk with dr fry and dr fry had agreed to accept him as transfer from dr Shawna Orleans. Please advise

## 2016-03-01 NOTE — Telephone Encounter (Signed)
Yes, per Dr. Sarajane Jews.

## 2016-03-05 NOTE — Telephone Encounter (Signed)
Pt has been sch

## 2016-03-11 ENCOUNTER — Encounter: Payer: Self-pay | Admitting: Family Medicine

## 2016-03-11 ENCOUNTER — Ambulatory Visit (INDEPENDENT_AMBULATORY_CARE_PROVIDER_SITE_OTHER): Payer: Managed Care, Other (non HMO) | Admitting: Family Medicine

## 2016-03-11 VITALS — BP 124/74 | HR 73 | Temp 97.8°F | Ht 70.0 in | Wt 236.0 lb

## 2016-03-11 DIAGNOSIS — L989 Disorder of the skin and subcutaneous tissue, unspecified: Secondary | ICD-10-CM

## 2016-03-11 DIAGNOSIS — Z23 Encounter for immunization: Secondary | ICD-10-CM | POA: Diagnosis not present

## 2016-03-11 DIAGNOSIS — K649 Unspecified hemorrhoids: Secondary | ICD-10-CM

## 2016-03-11 DIAGNOSIS — H538 Other visual disturbances: Secondary | ICD-10-CM | POA: Diagnosis not present

## 2016-03-11 DIAGNOSIS — D509 Iron deficiency anemia, unspecified: Secondary | ICD-10-CM

## 2016-03-11 NOTE — Progress Notes (Signed)
Pre visit review using our clinic review tool, if applicable. No additional management support is needed unless otherwise documented below in the visit note. 

## 2016-03-11 NOTE — Progress Notes (Signed)
   Subjective:    Patient ID: Micheal Hamilton, male    DOB: 1951/04/02, 65 y.o.   MRN: [id number]  HPI 65 yr old male to establish with [place] after transfering from Dr. Shawna Orleans. He has a few issues to discuss. First about 3 months ago had an episode that lasted about an hour of seeing flashes of lights in both eyes which he described as if he was "looking through a kaleidescope". He has had several more episodes like this since then. He has also had a few episodes in which part of his visual field would become blurry while the rest remained clear. No headaches with these and no other neurologic deficits. He has seen a Neurologist named Dr. Laurin Coder at the [facility], and his exams have all been normal. He was diagnosed with iron deficiency anemia and he has received several infusions of iron. Now he is taking oral iron supplements. He also taking vitamins C and D. He has been started on aspirin 81 mg daily. No explanation for the vision disturbances has been found. He also had a thorough eye examination with Dr. Tyrone Schimke, an Ophthalmologist 2 months ago, and his eyes are fine. His head has not been imaged yet, and Dr. Laurin Coder was going to do this next. In addition, [name] asks me to check a lesion that appeared on his nose about 3 months ago, and this is slowly getting larger.    Review of Systems  Constitutional: Negative.   HENT: Negative.   Eyes: Positive for visual disturbance. Negative for photophobia, pain, discharge, redness and itching.  Respiratory: Negative.   Cardiovascular: Negative.   Gastrointestinal: Negative.   Endocrine: Negative.   Neurological: Negative.        Objective:   Physical Exam  Constitutional: He is oriented to person, place, and time. He appears well-developed and well-nourished.  HENT:  Head: Normocephalic and atraumatic.  Right Ear: External ear normal.  Left Ear: External ear normal.  Nose: Nose normal.  Mouth/Throat: Oropharynx is clear and  moist.  Eyes: Conjunctivae and EOM are normal. Pupils are equal, round, and reactive to light.  Neck: No thyromegaly present.  Cardiovascular: Normal rate, regular rhythm, normal heart sounds and intact distal pulses.   Pulmonary/Chest: Effort normal and breath sounds normal.  Lymphadenopathy:    He has no cervical adenopathy.  Neurological: He is alert and oriented to person, place, and time. He has normal reflexes. No cranial nerve deficit. He exhibits normal muscle tone. Coordination normal.  Skin:  The right side of the nose has a small well demarcated smooth papular lesion that is light tan in color          Assessment & Plan:  He has had a few episodes of vision disturbances with no other symptoms, and these are likely optical migraines. We will get records from his Neurologist, and we will set him up for a brain MRI soon. We will follow the anemia with serial CBC's. He likely has a basal cell cancer on the nose, so we will refer him to have this removed.

## 2016-03-12 MED ORDER — FERROUS SULFATE 325 (65 FE) MG PO TABS: 325.0000 mg | ORAL_TABLET | Freq: Three times a day (TID) | ORAL | 0 refills | Status: DC

## 2016-03-12 MED ORDER — ASPIRIN EC 81 MG PO TBEC: 81.0000 mg | DELAYED_RELEASE_TABLET | Freq: Every day | ORAL | 0 refills | Status: DC

## 2016-04-01 ENCOUNTER — Other Ambulatory Visit: Payer: Managed Care, Other (non HMO)

## 2016-04-25 ENCOUNTER — Ambulatory Visit
Admission: RE | Admit: 2016-04-25 | Discharge: 2016-04-25 | Disposition: A | Discharge status: Home / Self Care | Payer: Managed Care, Other (non HMO) | Source: Ambulatory Visit | Attending: Family Medicine | Admitting: Family Medicine

## 2016-04-25 ENCOUNTER — Telehealth: Payer: Self-pay | Admitting: Family Medicine

## 2016-04-25 DIAGNOSIS — H538 Other visual disturbances: Secondary | ICD-10-CM

## 2016-04-25 MED ORDER — GADOBENATE DIMEGLUMINE 529 MG/ML IV SOLN
20.0000 mL | Freq: Once | INTRAVENOUS | Status: AC | PRN
  Administered 2016-04-25: 20 mL via INTRAVENOUS

## 2016-04-25 NOTE — Addendum Note (Signed)
Addended by: Alysia Penna A on: 04/25/2016 08:56 PM   Modules accepted: Orders

## 2016-04-25 NOTE — Telephone Encounter (Signed)
I will refer him to Surgery for this

## 2016-04-25 NOTE — Telephone Encounter (Signed)
Pt would like to have some hemorrhoids removed by the end of the year.  Pt not due for his colonoscopy until 10/2017.  Would like to know where he can have this done.

## 2016-04-26 NOTE — Telephone Encounter (Signed)
I left a voice message with below information. 

## 2016-07-09 ENCOUNTER — Other Ambulatory Visit: Payer: Self-pay | Admitting: Dermatology

## 2016-10-11 ENCOUNTER — Telehealth: Payer: Self-pay | Admitting: Family Medicine

## 2016-10-11 ENCOUNTER — Other Ambulatory Visit: Payer: Self-pay

## 2016-10-11 NOTE — Telephone Encounter (Signed)
Patient is requesting RX refill on ferrous sulfate 325 (65 FE) MG tablet and vit d121.25 mg and vit c   Pharmacy: [facility]: [phone]

## 2016-10-11 NOTE — Telephone Encounter (Signed)
This are OTC meds.

## 2016-10-14 NOTE — Telephone Encounter (Signed)
I left a voice message for pt to return our call, since these are over the counter medications.

## 2016-10-14 NOTE — Telephone Encounter (Signed)
I spoke with pt and advised to schedule appointment, pt is due for labs also, pt agreed.

## 2016-10-30 ENCOUNTER — Ambulatory Visit (INDEPENDENT_AMBULATORY_CARE_PROVIDER_SITE_OTHER): Payer: Managed Care, Other (non HMO) | Admitting: Family Medicine

## 2016-10-30 ENCOUNTER — Encounter: Payer: Self-pay | Admitting: Family Medicine

## 2016-10-30 VITALS — BP 134/88 | HR 69 | Temp 98.7°F | Ht 70.0 in | Wt 234.0 lb

## 2016-10-30 DIAGNOSIS — E559 Vitamin D deficiency, unspecified: Secondary | ICD-10-CM | POA: Insufficient documentation

## 2016-10-30 DIAGNOSIS — N138 Other obstructive and reflux uropathy: Secondary | ICD-10-CM | POA: Diagnosis not present

## 2016-10-30 DIAGNOSIS — D509 Iron deficiency anemia, unspecified: Secondary | ICD-10-CM

## 2016-10-30 DIAGNOSIS — N401 Enlarged prostate with lower urinary tract symptoms: Secondary | ICD-10-CM | POA: Diagnosis not present

## 2016-10-30 DIAGNOSIS — E782 Mixed hyperlipidemia: Secondary | ICD-10-CM | POA: Diagnosis not present

## 2016-10-30 LAB — LIPID PANEL
Cholesterol: 242 mg/dL — ABNORMAL HIGH (ref 0–200)
HDL: 36.6 mg/dL — AB (ref >39.00)
NONHDL: 205.21
TRIGLYCERIDES: 248 mg/dL — AB (ref 0.0–149.0)
Total CHOL/HDL Ratio: 7
VLDL: 49.6 mg/dL — AB (ref 0.0–40.0)

## 2016-10-30 LAB — BASIC METABOLIC PANEL
BUN: 13 mg/dL (ref 6–23)
CALCIUM: 10 mg/dL (ref 8.4–10.5)
CO2: 27 mEq/L (ref 19–32)
CREATININE: 0.85 mg/dL (ref 0.40–1.50)
Chloride: 104 mEq/L (ref 96–112)
GFR: 96.04 mL/min (ref >60.00)
GLUCOSE: 101 mg/dL — AB (ref 70–99)
Potassium: 4.9 mEq/L (ref 3.5–5.1)
Sodium: 140 mEq/L (ref 135–145)

## 2016-10-30 LAB — PSA: PSA: 2.23 ng/mL (ref 0.10–4.00)

## 2016-10-30 LAB — CBC WITH DIFFERENTIAL/PLATELET
BASOS PCT: 0.7 % (ref 0.0–3.0)
Basophils Absolute: 0 10*3/uL (ref 0.0–0.1)
EOS ABS: 0.2 10*3/uL (ref 0.0–0.7)
Eosinophils Relative: 3.4 % (ref 0.0–5.0)
HCT: 48.8 % (ref 39.0–52.0)
HEMOGLOBIN: 15.9 g/dL (ref 13.0–17.0)
LYMPHS PCT: 20.7 % (ref 12.0–46.0)
Lymphs Abs: 1.4 10*3/uL (ref 0.7–4.0)
MCHC: 32.6 g/dL (ref 30.0–36.0)
MCV: 80 fl (ref 78.0–100.0)
MONO ABS: 0.8 10*3/uL (ref 0.1–1.0)
Monocytes Relative: 12.5 % — ABNORMAL HIGH (ref 3.0–12.0)
Neutro Abs: 4.1 10*3/uL (ref 1.4–7.7)
Neutrophils Relative %: 62.7 % (ref 43.0–77.0)
Platelets: 341 10*3/uL (ref 150.0–400.0)
RBC: 6.1 Mil/uL — ABNORMAL HIGH (ref 4.22–5.81)
RDW: 15.3 % (ref 11.5–15.5)
WBC: 6.5 10*3/uL (ref 4.0–10.5)

## 2016-10-30 LAB — POC URINALSYSI DIPSTICK (AUTOMATED)
Bilirubin, UA: NEGATIVE
Glucose, UA: NEGATIVE
Ketones, UA: NEGATIVE
Leukocytes, UA: NEGATIVE
NITRITE UA: NEGATIVE
PH UA: 6 (ref 5.0–8.0)
Protein, UA: NEGATIVE
RBC UA: NEGATIVE
Spec Grav, UA: >=1.03 — AB (ref 1.010–1.025)
UROBILINOGEN UA: 0.2 U/dL

## 2016-10-30 LAB — TSH: TSH: 1.77 u[IU]/mL (ref 0.35–4.50)

## 2016-10-30 LAB — LDL CHOLESTEROL, DIRECT: Direct LDL: 157 mg/dL

## 2016-10-30 LAB — HEPATIC FUNCTION PANEL
ALBUMIN: 4.6 g/dL (ref 3.5–5.2)
ALK PHOS: 53 U/L (ref 39–117)
ALT: 15 U/L (ref 0–53)
AST: 17 U/L (ref 0–37)
Bilirubin, Direct: 0.1 mg/dL (ref 0.0–0.3)
Total Bilirubin: 0.4 mg/dL (ref 0.2–1.2)
Total Protein: 7.2 g/dL (ref 6.0–8.3)

## 2016-10-30 LAB — VITAMIN D 25 HYDROXY (VIT D DEFICIENCY, FRACTURES): VITD: 14.93 ng/mL — AB (ref 30.00–100.00)

## 2016-10-30 LAB — FERRITIN: Ferritin: 20.5 ng/mL — ABNORMAL LOW (ref 22.0–322.0)

## 2016-10-30 MED ORDER — FERROUS SULFATE 325 (65 FE) MG PO TABS: 325.0000 mg | ORAL_TABLET | Freq: Three times a day (TID) | ORAL | 3 refills | Status: DC

## 2016-10-30 MED ORDER — VITAMIN D (ERGOCALCIFEROL) 1.25 MG (50000 UNIT) PO CAPS: 50000.0000 [IU] | ORAL_CAPSULE | ORAL | 3 refills | Status: DC

## 2016-10-30 MED ORDER — VITAMIN C 250 MG PO TABS: 250.0000 mg | ORAL_TABLET | Freq: Three times a day (TID) | ORAL | 3 refills | Status: AC

## 2016-10-30 NOTE — Progress Notes (Signed)
   Subjective:    Patient ID: Micheal Hamilton, male    DOB: 07-04-1950, 66 y.o.   MRN: [id number]  HPI Here to follow up on several issues. He feels well and has no concerns. He has a hx of iron deficiency anemia and he takes iron orally along with vitamin C to assist absorption. He had several iron infusions a few years ago. This was followed at the [facility] for a couple years. He watches his diet to manage his lipids. He takes vitamin D weekly.    Review of Systems  Constitutional: Negative.   Respiratory: Negative.   Cardiovascular: Negative.   Gastrointestinal: Negative.   Neurological: Negative.        Objective:   Physical Exam  Constitutional: He is oriented to person, place, and time. He appears well-developed and well-nourished.  Cardiovascular: Normal rate, regular rhythm, normal heart sounds and intact distal pulses.   Pulmonary/Chest: Effort normal and breath sounds normal. No respiratory distress. He has no wheezes. He has no rales.  Neurological: He is alert and oriented to person, place, and time.          Assessment & Plan:  He seems to be doing well. We will get labs today to check the iron, vitamin D, and lipids among other things. Alysia Penna, MD

## 2016-10-30 NOTE — Patient Instructions (Signed)
WE NOW OFFER   [facility] FAST TRACK!!!  SAME DAY Appointments for ACUTE CARE  Such as: Sprains, Injuries, cuts, abrasions, rashes, muscle pain, joint pain, back pain Colds, flu, sore throats, headache, allergies, cough, fever  Ear pain, sinus and eye infections Abdominal pain, nausea, vomiting, diarrhea, upset stomach Animal/insect bites  3 Easy Ways to Schedule: Walk-In Scheduling Call in scheduling Mychart Sign-up: [email or web address]         

## 2016-12-23 ENCOUNTER — Ambulatory Visit (INDEPENDENT_AMBULATORY_CARE_PROVIDER_SITE_OTHER): Payer: Managed Care, Other (non HMO) | Admitting: Family Medicine

## 2016-12-23 ENCOUNTER — Encounter: Payer: Self-pay | Admitting: Family Medicine

## 2016-12-23 VITALS — BP 150/94 | HR 81 | Temp 98.4°F | Ht 70.0 in | Wt 234.0 lb

## 2016-12-23 DIAGNOSIS — L989 Disorder of the skin and subcutaneous tissue, unspecified: Secondary | ICD-10-CM | POA: Diagnosis not present

## 2016-12-23 DIAGNOSIS — G8929 Other chronic pain: Secondary | ICD-10-CM

## 2016-12-23 DIAGNOSIS — M25562 Pain in left knee: Secondary | ICD-10-CM

## 2016-12-23 NOTE — Patient Instructions (Signed)
WE NOW OFFER   [facility] FAST TRACK!!!  SAME DAY Appointments for ACUTE CARE  Such as: Sprains, Injuries, cuts, abrasions, rashes, muscle pain, joint pain, back pain Colds, flu, sore throats, headache, allergies, cough, fever  Ear pain, sinus and eye infections Abdominal pain, nausea, vomiting, diarrhea, upset stomach Animal/insect bites  3 Easy Ways to Schedule: Walk-In Scheduling Call in scheduling Mychart Sign-up: [email or web address]         

## 2016-12-23 NOTE — Progress Notes (Signed)
   Subjective:    Patient ID: Micheal Hamilton, male    DOB: September 09, 1950, 66 y.o.   MRN: [id number]  HPI Here for 2 issues. First he has had left knee pain for several years and it has been getting worse lately. He takes Ibuprofen as needed. Also one month ago he noticed a red spot appear on the penis, and ot has been slowly enlarging since then. It causes no discomfort. He has tried OTC cortisone cream and Neosporin with no improvement.   Review of Systems  Constitutional: Negative.   Respiratory: Negative.   Cardiovascular: Negative.   Musculoskeletal: Positive for arthralgias.  Skin: Positive for rash.       Objective:   Physical Exam  Constitutional: He appears well-developed and well-nourished.  Cardiovascular: Normal rate, regular rhythm, normal heart sounds and intact distal pulses.   Pulmonary/Chest: Effort normal and breath sounds normal.  Musculoskeletal:  Left knee shows full ROM, a little crepitus is present. Some joint space tenderness  Skin:  The glans of the penis has a red, raised lesion about 8 mm in diameter with pearly borders           Assessment & Plan:  He has a penile lesion that concerning for possible neoplasm, so we will refer him to the [facility] for removal. Refer to Orthopedics for the knee pain.  Alysia Penna, MD

## 2016-12-24 DIAGNOSIS — M25562 Pain in left knee: Secondary | ICD-10-CM | POA: Diagnosis not present

## 2016-12-27 ENCOUNTER — Telehealth: Payer: Self-pay | Admitting: Family Medicine

## 2016-12-27 NOTE — Telephone Encounter (Signed)
If this was a referral then office notes would have been faxed correct?

## 2016-12-27 NOTE — Telephone Encounter (Signed)
Pt calling stating that he has made an appointment to Dr. Tobias Alexander for next Thursday 01/02/17 at 4:00 and don't know if someone could send over any office notes to their office.  Pt state that if someone need to call him he will be available.

## 2017-01-02 DIAGNOSIS — L309 Dermatitis, unspecified: Secondary | ICD-10-CM | POA: Diagnosis not present

## 2017-01-17 DIAGNOSIS — H2513 Age-related nuclear cataract, bilateral: Secondary | ICD-10-CM | POA: Diagnosis not present

## 2017-01-17 DIAGNOSIS — H5203 Hypermetropia, bilateral: Secondary | ICD-10-CM | POA: Diagnosis not present

## 2017-04-02 IMAGING — MR MR HEAD WO/W CM
12 series · 48 of 48 positions shown · IV contrast (multihance)
Comparison: CT head 11/06/2009

CLINICAL DATA: Creatinine was obtained on site at [HOSPITAL]
[HOSPITAL] [HOSPITAL].

Results: Creatinine 0.9 mg/dL.
EXAM:
MRI HEAD WITHOUT AND WITH CONTRAST
TECHNIQUE: Multiplanar, multiecho pulse sequences of the brain and surrounding
structures were obtained without and with intravenous contrast.
CONTRAST:  20mL MULTIHANCE GADOBENATE DIMEGLUMINE 529 MG/ML IV SOLN

[Series 5: T1 · sagittal · 4.0mm · 0.75mm/px · 2 of 31 slices shown (1 of 3)]
[im 1/31]
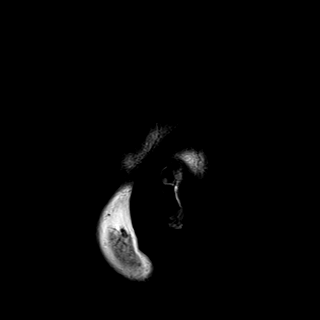
[im 31/31]
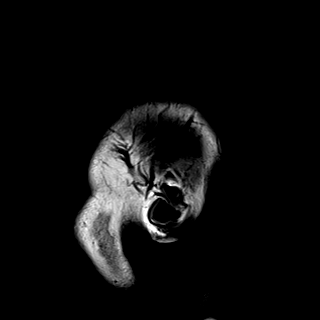

[Series 6: DWI · axial · 3.0mm · 1.44mm/px · z∈[-38,+98]mm · 5 of 86 slices shown (1 of 4)]
[im 1/86]
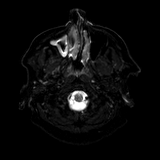
[im 22/86]
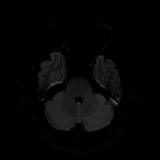
[im 43/86]
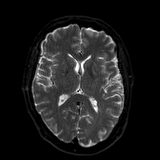
[im 64/86]
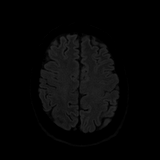
[im 86/86]
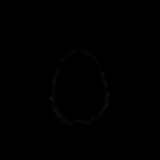

[Series 7: DWI · axial · 3.0mm · 1.44mm/px · z∈[-38,+98]mm · 3 of 43 slices shown (2 of 4)]
[im 1/43]
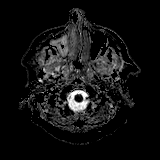
[im 22/43]
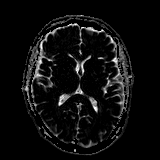
[im 43/43]
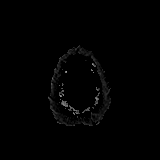

[Series 8: DWI · coronal · 5.0mm · 1.44mm/px · 4 of 64 slices shown (3 of 4)]
[im 1/64]
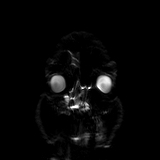
[im 22/64]
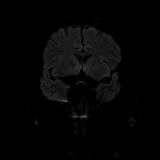
[im 43/64]
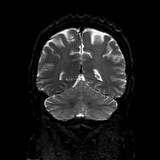
[im 64/64]
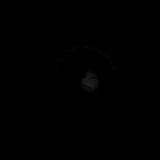

[Series 9: DWI · coronal · 5.0mm · 1.44mm/px · 2 of 32 slices shown (4 of 4)]
[im 1/32]
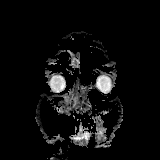
[im 32/32]
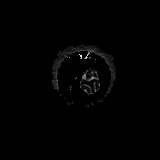

[Series 10: T2 · axial · 4.0mm · 0.36mm/px · z∈[-43,+95]mm · 2 of 28 slices shown]
[im 1/28]
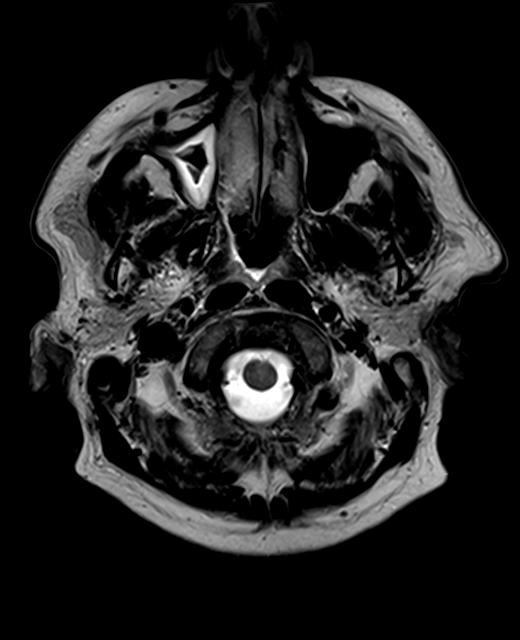
[im 28/28]
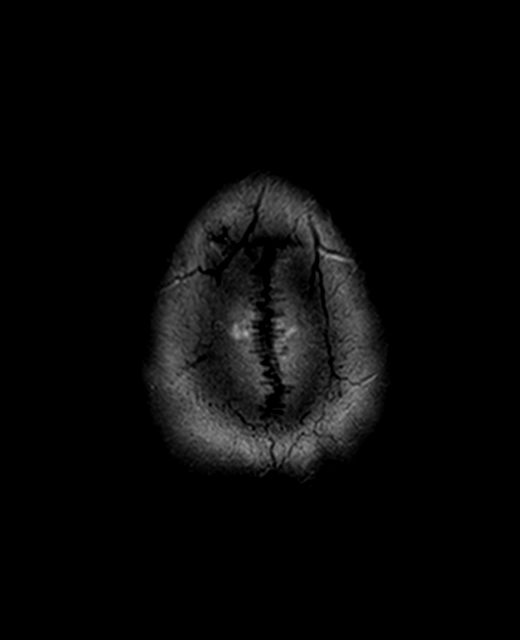

[Series 11: FLAIR · axial · 4.0mm · 0.72mm/px · z∈[-42,+96]mm · 2 of 28 slices shown]
[im 1/28]
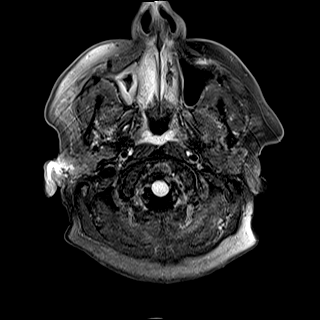
[im 28/28]
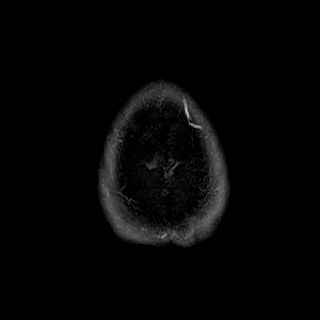

[Series 13: swi_images · axial · 1.5mm · 0.90mm/px · z∈[-44,+96]mm · 6 of 96 slices shown]
[im 1/96]
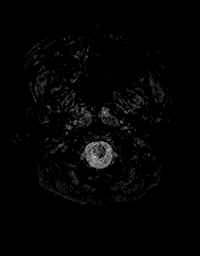
[im 20/96]
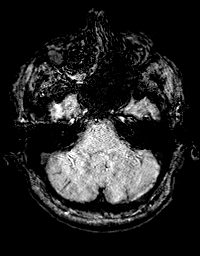
[im 39/96]
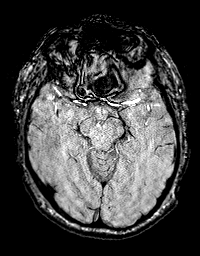
[im 58/96]
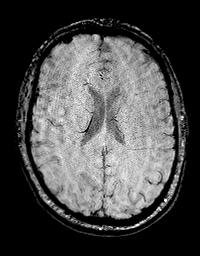
[im 77/96]
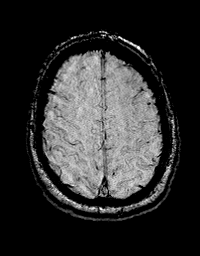
[im 96/96]
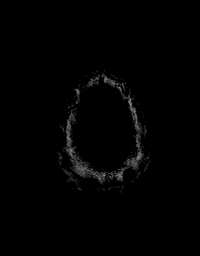

[Series 14: T1 · axial · 1.0mm · 0.90mm/px · z∈[-45,+96]mm · 9 of 144 slices shown (2 of 3)]
[im 1/144]
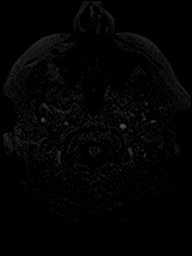
[im 18/144]
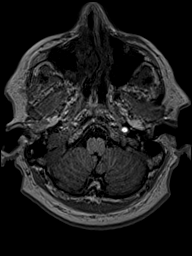
[im 36/144]
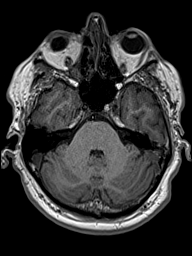
[im 54/144]
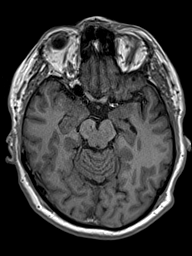
[im 72/144]
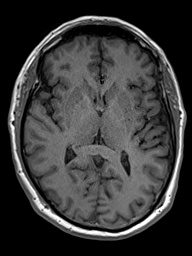
[im 90/144]
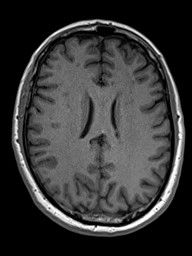
[im 108/144]
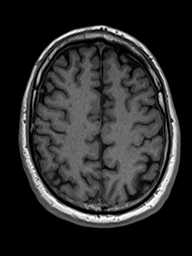
[im 126/144]
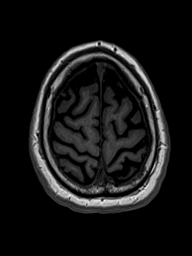
[im 144/144]
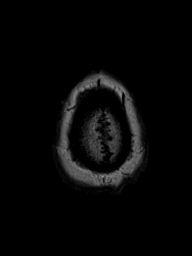

[Series 15: T2 post-contrast · coronal · 4.5mm · 0.36mm/px · 2 of 30 slices shown]
[im 1/30]
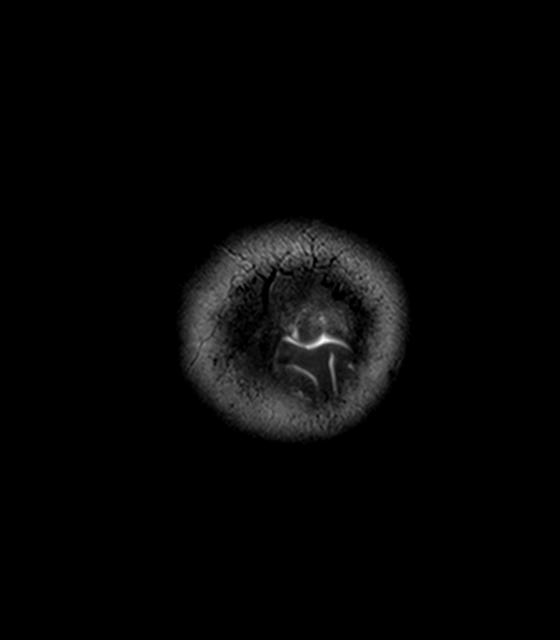
[im 30/30]
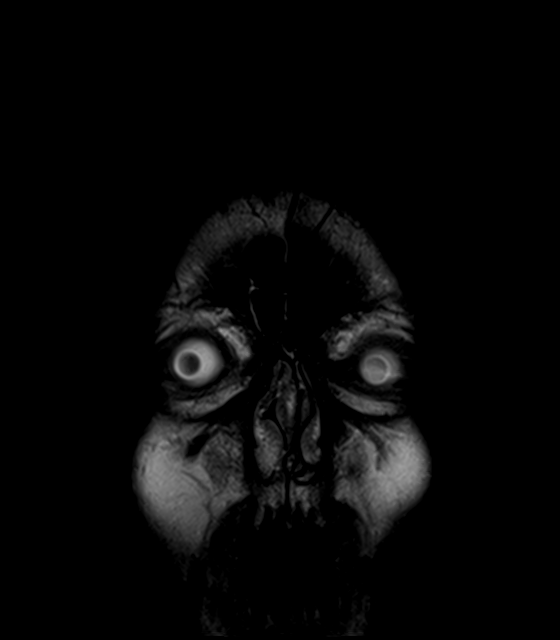

[Series 16: T1 · axial · 1.0mm · 0.90mm/px · z∈[-45,+96]mm · 9 of 144 slices shown (3 of 3)]
[im 1/144]
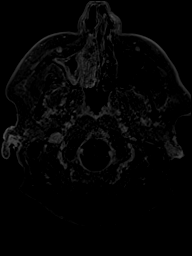
[im 18/144]
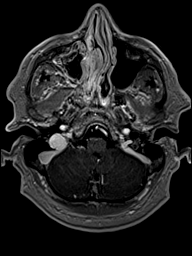
[im 36/144]
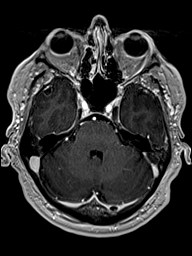
[im 54/144]
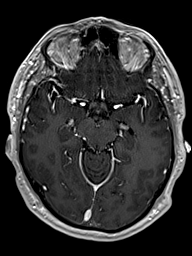
[im 72/144]
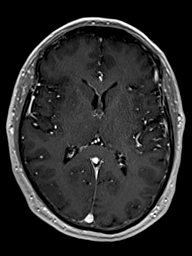
[im 90/144]
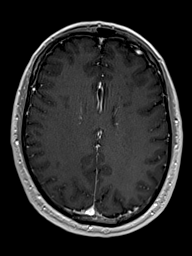
[im 108/144]
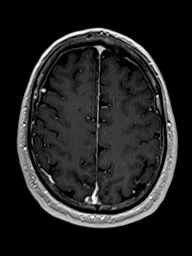
[im 126/144]
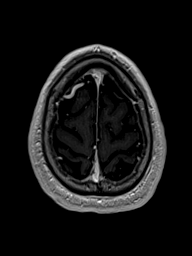
[im 144/144]
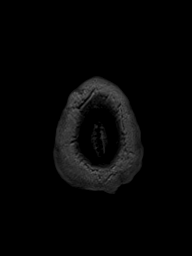

[Series 17: T1 post-contrast · coronal · 4.5mm · 0.72mm/px · 2 of 30 slices shown]
[im 1/30]
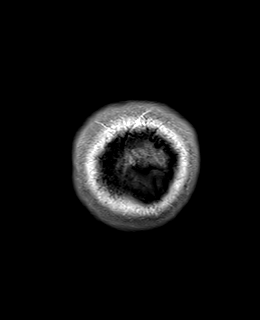
[im 30/30]
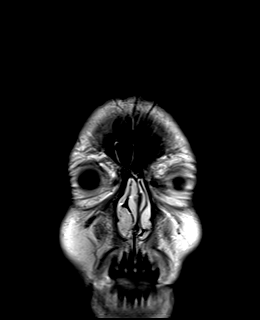

[48 of 48 positions shown; findings below may reference images not displayed]

FINDINGS: Brain: Ventricle size normal. Cerebral volume normal. Negative for
acute infarct. Scattered small white matter hyperintensities
bilaterally. Basal ganglia and brainstem and cerebellum normal.
Negative for hemorrhage or mass lesion. Pituitary normal in size.
Normal enhancement postcontrast infusion. No enhancing mass lesion.
Cavernous sinus normal bilaterally.

Vascular: Normal arterial flow voids.

Skull and upper cervical spine: Negative

Sinuses/Orbits: Mucosal edema and air-fluid level right maxillary
sinus. Mild mucosal edema elsewhere in the paranasal sinuses. Normal
orbit.

Other: None
IMPRESSION: Small subcortical and deep white matter hyperintensities
bilaterally. This is nonspecific but most likely related to chronic
microvascular ischemia or possibly migraine headache. Negative for
acute infarct or mass. No cause for blurred vision identified

Sinusitis with mucosal edema and air-fluid level right maxillary
sinus.

## 2017-09-22 ENCOUNTER — Encounter: Payer: Self-pay | Admitting: Gastroenterology

## 2017-09-25 ENCOUNTER — Encounter: Payer: Self-pay | Admitting: Gastroenterology

## 2017-10-29 ENCOUNTER — Other Ambulatory Visit: Payer: Self-pay | Admitting: Family Medicine

## 2017-11-04 ENCOUNTER — Other Ambulatory Visit: Payer: Self-pay | Admitting: Family Medicine

## 2018-02-16 ENCOUNTER — Ambulatory Visit: Payer: Self-pay

## 2018-02-16 NOTE — Telephone Encounter (Signed)
Pt. Reports he has had dizziness x 3 weeks. Worse when changing positions. Feels like the room is spinning. Comes and goes. Does feel tired. No new medications. Request an appointment for Thursday - scheduled. Instructed if symptoms worsen , to call back.  Reason for Disposition . [1] MILD dizziness (e.g., vertigo; walking normally) AND [2] has NOT been evaluated by physician for this  Answer Assessment - Initial Assessment Questions 1. DESCRIPTION: "Describe your dizziness."     Lightheaded 2. VERTIGO: "Do you feel like either you or the room is spinning or tilting?"      Yes 3. LIGHTHEADED: "Do you feel lightheaded?" (e.g., somewhat faint, woozy, weak upon standing)     Yes 4. SEVERITY: "How bad is it?"  "Can you walk?"   - MILD - Feels unsteady but walking normally.   - MODERATE - Feels very unsteady when walking, but not falling; interferes with normal activities (e.g., school, work) .   - SEVERE - Unable to walk without falling (requires assistance).     Mild 5. ONSET:  "When did the dizziness begin?"     3 weeks ago 6. AGGRAVATING FACTORS: "Does anything make it worse?" (e.g., standing, change in head position)     Changing positions 7. CAUSE: "What do you think is causing the dizziness?"      Unsure 8. RECURRENT SYMPTOM: "Have you had dizziness before?" If so, ask: "When was the last time?" "What happened that time?"     No 9. OTHER SYMPTOMS: "Do you have any other symptoms?" (e.g., headache, weakness, numbness, vomiting, earache)     Tired 10. PREGNANCY: "Is there any chance you are pregnant?" "When was your last menstrual period?"       n/a  Protocols used: DIZZINESS - VERTIGO-A-AH

## 2018-02-19 ENCOUNTER — Encounter: Payer: Self-pay | Admitting: Family Medicine

## 2018-02-19 ENCOUNTER — Ambulatory Visit: Payer: BLUE CROSS/BLUE SHIELD | Admitting: Family Medicine

## 2018-02-19 VITALS — BP 128/98 | HR 69 | Temp 97.6°F | Ht 70.0 in | Wt 234.8 lb

## 2018-02-19 DIAGNOSIS — H8110 Benign paroxysmal vertigo, unspecified ear: Secondary | ICD-10-CM

## 2018-02-19 MED ORDER — MECLIZINE HCL 25 MG PO TABS: 25.0000 mg | ORAL_TABLET | ORAL | 2 refills | Status: DC | PRN

## 2018-02-19 NOTE — Progress Notes (Signed)
   Subjective:    Patient ID: Micheal Hamilton, male    DOB: 1950-07-08, 67 y.o.   MRN: [id number]  HPI Here for 3 weeks of intermittent dizziness which make it seem that the room is spinning. Quick movements set this off but he feels fine when he is still. No headaches, no other neurologic deficits. He has a hx of BPV.    Review of Systems  Constitutional: Negative.   HENT: Negative.   Eyes: Negative.   Respiratory: Negative.   Cardiovascular: Negative.   Neurological: Positive for dizziness. Negative for tremors, seizures, syncope, facial asymmetry, speech difficulty, weakness, light-headedness, numbness and headaches.       Objective:   Physical Exam  Constitutional: He is oriented to person, place, and time. He appears well-developed and well-nourished. No distress.  HENT:  Right Ear: External ear normal.  Left Ear: External ear normal.  Nose: Nose normal.  Mouth/Throat: Oropharynx is clear and moist.  Eyes: Pupils are equal, round, and reactive to light. Conjunctivae and EOM are normal.  Neck: No thyromegaly present.  Cardiovascular: Normal rate, regular rhythm, normal heart sounds and intact distal pulses.  Pulmonary/Chest: Effort normal and breath sounds normal.  Lymphadenopathy:    He has no cervical adenopathy.  Neurological: He is alert and oriented to person, place, and time. No cranial nerve deficit. He exhibits normal muscle tone. Coordination normal.          Assessment & Plan:  Vertigo, drink fluids and take Zyrtec daily. Add Meclizine prn. Alysia Penna, MD

## 2018-04-12 ENCOUNTER — Other Ambulatory Visit: Payer: Self-pay | Admitting: Family Medicine

## 2018-04-15 ENCOUNTER — Telehealth: Payer: Self-pay

## 2018-04-15 NOTE — Telephone Encounter (Signed)
Copied from [place] [id number]. Topic: Quick Communication - Rx Refill/Question >> Apr 15, 2018 11:25 AM Judyann Munson wrote: Medication: Vitamin D, Ergocalciferol, (DRISDOL) 50000 units CAPS capsule   Has the patient contacted their pharmacy? yes Preferred Pharmacy (with phone number or street name): [id number] - [place], [place] [phone] (Phone) [phone] (Fax)    Agent: Please be advised that RX refills may take up to 3 business days. We ask that you follow-up with your pharmacy.

## 2018-04-17 MED ORDER — VITAMIN D (ERGOCALCIFEROL) 1.25 MG (50000 UNIT) PO CAPS: 50000.0000 [IU] | ORAL_CAPSULE | ORAL | 3 refills | Status: DC

## 2018-04-17 NOTE — Telephone Encounter (Signed)
Sent in refills to the pharmacy.

## 2018-04-17 NOTE — Telephone Encounter (Signed)
Call in #[phone] with 3 rf

## 2018-04-28 ENCOUNTER — Ambulatory Visit (INDEPENDENT_AMBULATORY_CARE_PROVIDER_SITE_OTHER): Payer: BLUE CROSS/BLUE SHIELD | Admitting: Family Medicine

## 2018-04-28 ENCOUNTER — Encounter: Payer: Self-pay | Admitting: Family Medicine

## 2018-04-28 VITALS — BP 110/78 | HR 75 | Temp 97.9°F | Ht 69.0 in | Wt 235.1 lb

## 2018-04-28 DIAGNOSIS — D509 Iron deficiency anemia, unspecified: Secondary | ICD-10-CM

## 2018-04-28 DIAGNOSIS — E559 Vitamin D deficiency, unspecified: Secondary | ICD-10-CM

## 2018-04-28 DIAGNOSIS — R7303 Prediabetes: Secondary | ICD-10-CM

## 2018-04-28 DIAGNOSIS — Z0000 Encounter for general adult medical examination without abnormal findings: Secondary | ICD-10-CM | POA: Diagnosis not present

## 2018-04-28 DIAGNOSIS — K649 Unspecified hemorrhoids: Secondary | ICD-10-CM

## 2018-04-28 LAB — CBC WITH DIFFERENTIAL/PLATELET
Basophils Absolute: 0.1 K/uL (ref 0.0–0.1)
Basophils Relative: 0.7 % (ref 0.0–3.0)
Eosinophils Absolute: 0.2 K/uL (ref 0.0–0.7)
Eosinophils Relative: 1.9 % (ref 0.0–5.0)
HCT: 51.5 % (ref 39.0–52.0)
Hemoglobin: 17 g/dL (ref 13.0–17.0)
Lymphocytes Relative: 20.3 % (ref 12.0–46.0)
Lymphs Abs: 1.6 K/uL (ref 0.7–4.0)
MCHC: 33 g/dL (ref 30.0–36.0)
MCV: 83.5 fl (ref 78.0–100.0)
Monocytes Absolute: 1 K/uL (ref 0.1–1.0)
Monocytes Relative: 11.9 % (ref 3.0–12.0)
Neutro Abs: 5.3 K/uL (ref 1.4–7.7)
Neutrophils Relative %: 65.2 % (ref 43.0–77.0)
Platelets: 354 K/uL (ref 150.0–400.0)
RBC: 6.17 Mil/uL — ABNORMAL HIGH (ref 4.22–5.81)
RDW: 14 % (ref 11.5–15.5)
WBC: 8.1 K/uL (ref 4.0–10.5)

## 2018-04-28 LAB — POC URINALSYSI DIPSTICK (AUTOMATED)
Bilirubin, UA: NEGATIVE
Blood, UA: NEGATIVE
Glucose, UA: NEGATIVE
KETONES UA: NEGATIVE
Leukocytes, UA: NEGATIVE
Nitrite, UA: NEGATIVE
PH UA: 5.5 (ref 5.0–8.0)
PROTEIN UA: NEGATIVE
SPEC GRAV UA: 1.025 (ref 1.010–1.025)
Urobilinogen, UA: 0.2 E.U./dL

## 2018-04-28 LAB — IRON: Iron: 65 ug/dL (ref 42–165)

## 2018-04-28 LAB — BASIC METABOLIC PANEL WITH GFR
BUN: 18 mg/dL (ref 6–23)
CO2: 29 meq/L (ref 19–32)
Calcium: 10.4 mg/dL (ref 8.4–10.5)
Chloride: 102 meq/L (ref 96–112)
Creatinine, Ser: 0.94 mg/dL (ref 0.40–1.50)
GFR: 85.12 mL/min (ref >60.00)
Glucose, Bld: 102 mg/dL — ABNORMAL HIGH (ref 70–99)
Potassium: 4.8 meq/L (ref 3.5–5.1)
Sodium: 141 meq/L (ref 135–145)

## 2018-04-28 LAB — HEPATIC FUNCTION PANEL
ALT: 20 U/L (ref 0–53)
AST: 21 U/L (ref 0–37)
Albumin: 4.8 g/dL (ref 3.5–5.2)
Alkaline Phosphatase: 59 U/L (ref 39–117)
Bilirubin, Direct: 0.1 mg/dL (ref 0.0–0.3)
TOTAL PROTEIN: 7.7 g/dL (ref 6.0–8.3)
Total Bilirubin: 0.6 mg/dL (ref 0.2–1.2)

## 2018-04-28 LAB — FERRITIN: Ferritin: 37 ng/mL (ref 22.0–322.0)

## 2018-04-28 LAB — LIPID PANEL
CHOL/HDL RATIO: 6
Cholesterol: 272 mg/dL — ABNORMAL HIGH (ref 0–200)
HDL: 42.7 mg/dL (ref >39.00)
NonHDL: 229.69
Triglycerides: 313 mg/dL — ABNORMAL HIGH (ref 0.0–149.0)
VLDL: 62.6 mg/dL — ABNORMAL HIGH (ref 0.0–40.0)

## 2018-04-28 LAB — HEMOGLOBIN A1C: Hgb A1c MFr Bld: 5.9 % (ref 4.6–6.5)

## 2018-04-28 LAB — PSA: PSA: 2.06 ng/mL (ref 0.10–4.00)

## 2018-04-28 LAB — LDL CHOLESTEROL, DIRECT: Direct LDL: 193 mg/dL

## 2018-04-28 LAB — TSH: TSH: 1.69 u[IU]/mL (ref 0.35–4.50)

## 2018-04-28 LAB — VITAMIN D 25 HYDROXY (VIT D DEFICIENCY, FRACTURES): VITD: 27.27 ng/mL — ABNORMAL LOW (ref 30.00–100.00)

## 2018-04-28 MED ORDER — MOMETASONE FUROATE 0.1 % EX CREA: 1.0000 "application " | TOPICAL_CREAM | Freq: Two times a day (BID) | CUTANEOUS | 5 refills | Status: DC | PRN

## 2018-04-28 NOTE — Progress Notes (Signed)
   Subjective:    Patient ID: Micheal Hamilton, male    DOB: 06-21-1950, 67 y.o.   MRN: [id number]  HPI Here for a well exam. His only concern is painful hemorrhoids. He had these banded by Dr. Rosendo Gros at [facility] a few years ago, but they have come back. They bleed from time to time. He is past due for a colonoscopy.    Review of Systems  Constitutional: Negative.   HENT: Negative.   Eyes: Negative.   Respiratory: Negative.   Cardiovascular: Negative.   Gastrointestinal: Positive for anal bleeding.  Genitourinary: Negative.   Musculoskeletal: Negative.   Skin: Negative.   Neurological: Negative.   Psychiatric/Behavioral: Negative.        Objective:   Physical Exam  Constitutional: He is oriented to person, place, and time. He appears well-developed and well-nourished. No distress.  HENT:  Head: Normocephalic and atraumatic.  Right Ear: External ear normal.  Left Ear: External ear normal.  Nose: Nose normal.  Mouth/Throat: Oropharynx is clear and moist. No oropharyngeal exudate.  Eyes: Pupils are equal, round, and reactive to light. Conjunctivae and EOM are normal. Right eye exhibits no discharge. Left eye exhibits no discharge. No scleral icterus.  Neck: Neck supple. No JVD present. No tracheal deviation present. No thyromegaly present.  Cardiovascular: Normal rate, regular rhythm, normal heart sounds and intact distal pulses. Exam reveals no gallop and no friction rub.  No murmur heard. Pulmonary/Chest: Effort normal and breath sounds normal. No respiratory distress. He has no wheezes. He has no rales. He exhibits no tenderness.  Abdominal: Soft. Bowel sounds are normal. He exhibits no distension and no mass. There is no tenderness. There is no rebound and no guarding.  Genitourinary: Prostate normal and penis normal. Rectal exam shows guaiac negative stool. No penile tenderness.  Genitourinary Comments: Several tender external hemorrhoids   Musculoskeletal:  Normal range of motion. He exhibits no edema or tenderness.  Lymphadenopathy:    He has no cervical adenopathy.  Neurological: He is alert and oriented to person, place, and time. He has normal reflexes. He displays normal reflexes. No cranial nerve deficit. He exhibits normal muscle tone. Coordination normal.  Skin: Skin is warm and dry. No rash noted. He is not diaphoretic. No erythema. No pallor.  Psychiatric: He has a normal mood and affect. His behavior is normal. Judgment and thought content normal.          Assessment & Plan:  Well exam. We discussed diet and exercise. Get fasting labs. He will call the GI office to set up the colonoscopy. We will arrange for him to see Dr. Rosendo Gros again.  Alysia Penna, MD

## 2018-04-29 ENCOUNTER — Encounter: Payer: Self-pay | Admitting: *Deleted

## 2018-05-13 ENCOUNTER — Other Ambulatory Visit: Payer: Self-pay | Admitting: Family Medicine

## 2018-11-21 ENCOUNTER — Other Ambulatory Visit: Payer: Self-pay | Admitting: Family Medicine

## 2019-01-07 ENCOUNTER — Other Ambulatory Visit: Payer: Self-pay

## 2019-01-07 DIAGNOSIS — Z20822 Contact with and (suspected) exposure to covid-19: Secondary | ICD-10-CM

## 2019-01-10 LAB — NOVEL CORONAVIRUS, NAA: SARS-CoV-2, NAA: NOT DETECTED

## 2019-05-01 DIAGNOSIS — R69 Illness, unspecified: Secondary | ICD-10-CM | POA: Diagnosis not present

## 2019-05-19 ENCOUNTER — Other Ambulatory Visit: Payer: Self-pay | Admitting: Family Medicine

## 2019-06-23 DIAGNOSIS — Z20822 Contact with and (suspected) exposure to covid-19: Secondary | ICD-10-CM | POA: Diagnosis not present

## 2019-07-10 ENCOUNTER — Ambulatory Visit: Payer: BLUE CROSS/BLUE SHIELD

## 2019-07-15 ENCOUNTER — Ambulatory Visit: Payer: Medicare HMO | Attending: Internal Medicine

## 2019-07-15 DIAGNOSIS — Z23 Encounter for immunization: Secondary | ICD-10-CM | POA: Insufficient documentation

## 2019-07-15 NOTE — Progress Notes (Signed)
   Covid-19 Vaccination Clinic  Name:  KABEL EYLES    MRN: [id number] DOB: 03/01/1951  07/15/2019  Mr. Wehrer was observed post Covid-19 immunization for 15 minutes without incidence. He was provided with Vaccine Information Sheet and instruction to access the V-Safe system.   Mr. Suver was instructed to call 911 with any severe reactions post vaccine: Marland Kitchen Difficulty breathing  . Swelling of your face and throat  . A fast heartbeat  . A bad rash all over your body  . Dizziness and weakness    Immunizations Administered    Name Date Dose VIS Date Route   Pfizer COVID-19 Vaccine 07/15/2019  8:49 AM 0.3 mL 05/21/2019 Intramuscular   Manufacturer: [place]   Lot: [id number]   [place]: [id number]

## 2019-07-16 ENCOUNTER — Ambulatory Visit (INDEPENDENT_AMBULATORY_CARE_PROVIDER_SITE_OTHER): Payer: Medicare HMO | Admitting: Family Medicine

## 2019-07-16 ENCOUNTER — Other Ambulatory Visit: Payer: Self-pay

## 2019-07-16 ENCOUNTER — Encounter: Payer: Self-pay | Admitting: Family Medicine

## 2019-07-16 VITALS — BP 110/70 | HR 74 | Temp 97.6°F | Wt 221.2 lb

## 2019-07-16 DIAGNOSIS — M25562 Pain in left knee: Secondary | ICD-10-CM | POA: Diagnosis not present

## 2019-07-16 DIAGNOSIS — R7303 Prediabetes: Secondary | ICD-10-CM | POA: Diagnosis not present

## 2019-07-16 DIAGNOSIS — D509 Iron deficiency anemia, unspecified: Secondary | ICD-10-CM

## 2019-07-16 DIAGNOSIS — R972 Elevated prostate specific antigen [PSA]: Secondary | ICD-10-CM | POA: Diagnosis not present

## 2019-07-16 DIAGNOSIS — E782 Mixed hyperlipidemia: Secondary | ICD-10-CM | POA: Diagnosis not present

## 2019-07-16 DIAGNOSIS — E559 Vitamin D deficiency, unspecified: Secondary | ICD-10-CM | POA: Diagnosis not present

## 2019-07-16 DIAGNOSIS — G8929 Other chronic pain: Secondary | ICD-10-CM | POA: Diagnosis not present

## 2019-07-16 LAB — HEPATIC FUNCTION PANEL
ALT: 19 U/L (ref 0–53)
AST: 18 U/L (ref 0–37)
Albumin: 4.8 g/dL (ref 3.5–5.2)
Alkaline Phosphatase: 72 U/L (ref 39–117)
Bilirubin, Direct: 0.1 mg/dL (ref 0.0–0.3)
Total Bilirubin: 0.6 mg/dL (ref 0.2–1.2)
Total Protein: 7.6 g/dL (ref 6.0–8.3)

## 2019-07-16 LAB — URINALYSIS, ROUTINE W REFLEX MICROSCOPIC
Bilirubin Urine: NEGATIVE
Hgb urine dipstick: NEGATIVE
Ketones, ur: NEGATIVE
Nitrite: NEGATIVE
RBC / HPF: NONE SEEN (ref 0–?)
Specific Gravity, Urine: 1.02 (ref 1.000–1.030)
Total Protein, Urine: NEGATIVE
Urine Glucose: NEGATIVE
Urobilinogen, UA: 0.2 (ref 0.0–1.0)
pH: 6 (ref 5.0–8.0)

## 2019-07-16 LAB — LDL CHOLESTEROL, DIRECT: Direct LDL: 174 mg/dL

## 2019-07-16 LAB — CBC WITH DIFFERENTIAL/PLATELET
Basophils Absolute: 0.1 10*3/uL (ref 0.0–0.1)
Basophils Relative: 0.7 % (ref 0.0–3.0)
Eosinophils Absolute: 0.1 10*3/uL (ref 0.0–0.7)
Eosinophils Relative: 1.4 % (ref 0.0–5.0)
HCT: 51.2 % (ref 39.0–52.0)
Hemoglobin: 16.6 g/dL (ref 13.0–17.0)
Lymphocytes Relative: 17.7 % (ref 12.0–46.0)
Lymphs Abs: 1.4 10*3/uL (ref 0.7–4.0)
MCHC: 32.5 g/dL (ref 30.0–36.0)
MCV: 83.3 fl (ref 78.0–100.0)
Monocytes Absolute: 0.9 10*3/uL (ref 0.1–1.0)
Monocytes Relative: 11.3 % (ref 3.0–12.0)
Neutro Abs: 5.3 10*3/uL (ref 1.4–7.7)
Neutrophils Relative %: 68.9 % (ref 43.0–77.0)
Platelets: 340 10*3/uL (ref 150.0–400.0)
RBC: 6.14 Mil/uL — ABNORMAL HIGH (ref 4.22–5.81)
RDW: 14.2 % (ref 11.5–15.5)
WBC: 7.7 10*3/uL (ref 4.0–10.5)

## 2019-07-16 LAB — LIPID PANEL
Cholesterol: 268 mg/dL — ABNORMAL HIGH (ref 0–200)
HDL: 38.3 mg/dL — ABNORMAL LOW (ref >39.00)
NonHDL: 229.25
Total CHOL/HDL Ratio: 7
Triglycerides: 372 mg/dL — ABNORMAL HIGH (ref 0.0–149.0)
VLDL: 74.4 mg/dL — ABNORMAL HIGH (ref 0.0–40.0)

## 2019-07-16 LAB — BASIC METABOLIC PANEL
BUN: 11 mg/dL (ref 6–23)
CO2: 31 mEq/L (ref 19–32)
Calcium: 10.3 mg/dL (ref 8.4–10.5)
Chloride: 101 mEq/L (ref 96–112)
Creatinine, Ser: 0.89 mg/dL (ref 0.40–1.50)
GFR: 84.99 mL/min (ref >60.00)
Glucose, Bld: 102 mg/dL — ABNORMAL HIGH (ref 70–99)
Potassium: 4.9 mEq/L (ref 3.5–5.1)
Sodium: 139 mEq/L (ref 135–145)

## 2019-07-16 LAB — HEMOGLOBIN A1C: Hgb A1c MFr Bld: 6 % (ref 4.6–6.5)

## 2019-07-16 LAB — IBC + FERRITIN
Ferritin: 70.6 ng/mL (ref 22.0–322.0)
Iron: 60 ug/dL (ref 42–165)
Saturation Ratios: 17.9 % — ABNORMAL LOW (ref 20.0–50.0)
Transferrin: 240 mg/dL (ref 212.0–360.0)

## 2019-07-16 LAB — TSH: TSH: 1.53 u[IU]/mL (ref 0.35–4.50)

## 2019-07-16 LAB — IRON: Iron: 60 ug/dL (ref 42–165)

## 2019-07-16 LAB — VITAMIN D 25 HYDROXY (VIT D DEFICIENCY, FRACTURES): VITD: 35.11 ng/mL (ref 30.00–100.00)

## 2019-07-16 LAB — PSA: PSA: 7.1 ng/mL — ABNORMAL HIGH (ref 0.10–4.00)

## 2019-07-16 MED ORDER — DICLOFENAC SODIUM 75 MG PO TBEC: 75.0000 mg | DELAYED_RELEASE_TABLET | Freq: Two times a day (BID) | ORAL | 3 refills | Status: DC

## 2019-07-16 MED ORDER — FERROUS SULFATE 325 (65 FE) MG PO TABS: ORAL_TABLET | ORAL | 3 refills | Status: DC

## 2019-07-16 MED ORDER — VITAMIN D (ERGOCALCIFEROL) 1.25 MG (50000 UNIT) PO CAPS: 50000.0000 [IU] | ORAL_CAPSULE | ORAL | 3 refills | Status: DC

## 2019-07-16 MED ORDER — MOMETASONE FUROATE 0.1 % EX CREA: 1.0000 "application " | TOPICAL_CREAM | Freq: Two times a day (BID) | CUTANEOUS | 5 refills | Status: DC | PRN

## 2019-07-16 NOTE — Progress Notes (Signed)
   Subjective:    Patient ID: Micheal Hamilton, male    DOB: 1950-09-20, 69 y.o.   MRN: [id number]  HPI Here to follow up. He is doing well except for chronic joint pain, especially the knees. Ibuprofen no longer helps much. He is fasting for lab work today.    Review of Systems  Constitutional: Negative.   Respiratory: Negative.   Cardiovascular: Negative.   Musculoskeletal: Positive for arthralgias.       Objective:   Physical Exam Constitutional:      Appearance: Normal appearance. He is not ill-appearing.  Cardiovascular:     Rate and Rhythm: Normal rate and regular rhythm.     Pulses: Normal pulses.     Heart sounds: Normal heart sounds.  Pulmonary:     Effort: Pulmonary effort is normal.     Breath sounds: Normal breath sounds.  Neurological:     Mental Status: He is alert.           Assessment & Plan:  He seems to be doing well. Get labs today. He will try Diclofenac as needed for joint pains.  Alysia Penna, MD

## 2019-07-21 ENCOUNTER — Other Ambulatory Visit: Payer: Self-pay

## 2019-07-21 MED ORDER — DOXYCYCLINE HYCLATE 100 MG PO TABS: 100.0000 mg | ORAL_TABLET | Freq: Two times a day (BID) | ORAL | 0 refills | Status: DC

## 2019-07-21 NOTE — Addendum Note (Signed)
Addended by: Alysia Penna A on: 07/21/2019 07:58 AM   Modules accepted: Orders

## 2019-07-22 ENCOUNTER — Telehealth: Payer: Self-pay | Admitting: Family Medicine

## 2019-07-22 NOTE — Telephone Encounter (Signed)
Pt is returning a call about his lab results.  Contact number [phone]

## 2019-07-23 ENCOUNTER — Other Ambulatory Visit: Payer: Self-pay

## 2019-07-23 ENCOUNTER — Telehealth (INDEPENDENT_AMBULATORY_CARE_PROVIDER_SITE_OTHER): Payer: Medicare HMO | Admitting: Family Medicine

## 2019-07-23 DIAGNOSIS — Z0000 Encounter for general adult medical examination without abnormal findings: Secondary | ICD-10-CM | POA: Diagnosis not present

## 2019-07-23 DIAGNOSIS — E782 Mixed hyperlipidemia: Secondary | ICD-10-CM | POA: Diagnosis not present

## 2019-07-23 DIAGNOSIS — R8281 Pyuria: Secondary | ICD-10-CM | POA: Diagnosis not present

## 2019-07-23 DIAGNOSIS — R972 Elevated prostate specific antigen [PSA]: Secondary | ICD-10-CM

## 2019-07-23 NOTE — Telephone Encounter (Signed)
See result notes. 

## 2019-07-23 NOTE — Progress Notes (Signed)
Virtual Visit via Telephone Note  I connected with the patient on 07/23/19 at  8:15 AM EST by telephone and verified that I am speaking with the correct person using two identifiers.   I discussed the limitations, risks, security and privacy concerns of performing an evaluation and management service by telephone and the availability of in person appointments. I also discussed with the patient that there may be a patient responsible charge related to this service. The patient expressed understanding and agreed to proceed.  Location patient: home Location provider: work or home office Participants present for the call: patient, provider Patient did not have a visit in the prior 7 days to address this/these issue(s).   History of Present Illness: Here to go over recent lab results. His PSA had jumped up to 7, but he had WBC in the urine so he likely has a prostatitis. He denies any urinary symptoms. We will treat with Doxycycline and recheck the PSA in 14 days. The LDL was 174 as well.    Observations/Objective: Patient sounds cheerful and well on the phone. I do not appreciate any SOB. Speech and thought processing are grossly intact. Patient reported vitals:  Assessment and Plan: Likely prostatitis and elevated PSA. We will treat and recheck a PSA as above. He will tighten up on his diet.   Follow Up Instructions:     [id number] [id number] [id number] 11-20 [id number] 21-30 I did not refer this patient for an OV in the next 24 hours for this/these issue(s).  I discussed the assessment and treatment plan with the patient. The patient was provided an opportunity to ask questions and all were answered. The patient agreed with the plan and demonstrated an understanding of the instructions.   The patient was advised to call back or seek an in-person evaluation if the symptoms worsen or if the condition fails to improve as anticipated.  I provided 12 minutes of non-face-to-face time during this  encounter.   Alysia Penna, MD

## 2019-07-31 ENCOUNTER — Ambulatory Visit: Payer: BLUE CROSS/BLUE SHIELD

## 2019-08-06 ENCOUNTER — Encounter: Payer: Self-pay | Admitting: Gastroenterology

## 2019-08-06 ENCOUNTER — Other Ambulatory Visit (INDEPENDENT_AMBULATORY_CARE_PROVIDER_SITE_OTHER): Payer: Medicare HMO

## 2019-08-06 ENCOUNTER — Other Ambulatory Visit: Payer: Self-pay

## 2019-08-06 ENCOUNTER — Encounter: Payer: Self-pay | Admitting: Family Medicine

## 2019-08-06 DIAGNOSIS — R972 Elevated prostate specific antigen [PSA]: Secondary | ICD-10-CM | POA: Diagnosis not present

## 2019-08-06 LAB — URINALYSIS
Bilirubin Urine: NEGATIVE
Hgb urine dipstick: NEGATIVE
Ketones, ur: NEGATIVE
Leukocytes,Ua: NEGATIVE
Nitrite: NEGATIVE
Specific Gravity, Urine: >=1.03 — AB (ref 1.000–1.030)
Total Protein, Urine: NEGATIVE
Urine Glucose: NEGATIVE
Urobilinogen, UA: 0.2 (ref 0.0–1.0)
pH: 5.5 (ref 5.0–8.0)

## 2019-08-06 LAB — PSA: PSA: 3.74 ng/mL (ref 0.10–4.00)

## 2019-08-06 NOTE — Telephone Encounter (Signed)
Just to be careful, let's recheck a PSA in 90 days

## 2019-08-09 ENCOUNTER — Ambulatory Visit: Payer: Medicare HMO | Attending: Internal Medicine

## 2019-08-09 ENCOUNTER — Other Ambulatory Visit: Payer: Self-pay

## 2019-08-09 DIAGNOSIS — Z23 Encounter for immunization: Secondary | ICD-10-CM | POA: Insufficient documentation

## 2019-08-09 NOTE — Progress Notes (Signed)
   Covid-19 Vaccination Clinic  Name:  NOOH MARKUNAS    MRN: [id number] DOB: 12-Nov-1950  08/09/2019  Mr. Rubio was observed post Covid-19 immunization for 15 minutes without incidence. He was provided with Vaccine Information Sheet and instruction to access the V-Safe system.   Mr. Pavao was instructed to call 911 with any severe reactions post vaccine: Marland Kitchen Difficulty breathing  . Swelling of your face and throat  . A fast heartbeat  . A bad rash all over your body  . Dizziness and weakness    Immunizations Administered    Name Date Dose VIS Date Route   Pfizer COVID-19 Vaccine 08/09/2019  8:32 AM 0.3 mL 05/21/2019 Intramuscular   Manufacturer: [place]   Lot: [id number]   [place]: [id number]

## 2019-09-06 ENCOUNTER — Other Ambulatory Visit: Payer: Self-pay

## 2019-09-06 ENCOUNTER — Ambulatory Visit (AMBULATORY_SURGERY_CENTER): Payer: Self-pay | Admitting: *Deleted

## 2019-09-06 VITALS — Temp 97.3°F | Ht 69.0 in | Wt 217.0 lb

## 2019-09-06 DIAGNOSIS — Z86010 Personal history of colonic polyps: Secondary | ICD-10-CM

## 2019-09-06 NOTE — Progress Notes (Signed)
Completed covid vaccines 08-09-2019  No egg or soy allergy known to patient  No issues with past sedation with any surgeries  or procedures, no intubation problems  No diet pills per patient No home 02 use per patient  No blood thinners per patient  Pt denies issues with constipation  No A fib or A flutter  EMMI video sent to pt's e mail   Due to the COVID-19 pandemic we are asking patients to follow these guidelines. Please only bring one care partner. Please be aware that your care partner may wait in the car in the parking lot or if they feel like they will be too hot to wait in the car, they may wait in the lobby on the 4th floor. All care partners are required to wear a mask the entire time (we do not have any that we can provide them), they need to practice social distancing, and we will do a Covid check for all patient's and care partners when you arrive. Also we will check their temperature and your temperature. If the care partner waits in their car they need to stay in the parking lot the entire time and we will call them on their cell phone when the patient is ready for discharge so they can bring the car to the front of the building. Also all patient's will need to wear a mask into building.

## 2019-09-16 ENCOUNTER — Encounter: Payer: Self-pay | Admitting: Gastroenterology

## 2019-09-17 ENCOUNTER — Other Ambulatory Visit: Payer: Self-pay

## 2019-09-17 ENCOUNTER — Ambulatory Visit (AMBULATORY_SURGERY_CENTER): Payer: Medicare HMO | Admitting: Gastroenterology

## 2019-09-17 ENCOUNTER — Encounter: Payer: Self-pay | Admitting: Gastroenterology

## 2019-09-17 VITALS — BP 129/89 | HR 141 | Temp 96.6°F | Resp 13 | Ht 69.0 in | Wt 217.0 lb

## 2019-09-17 DIAGNOSIS — K529 Noninfective gastroenteritis and colitis, unspecified: Secondary | ICD-10-CM | POA: Diagnosis not present

## 2019-09-17 DIAGNOSIS — Z86010 Personal history of colonic polyps: Secondary | ICD-10-CM | POA: Diagnosis not present

## 2019-09-17 DIAGNOSIS — E7800 Pure hypercholesterolemia, unspecified: Secondary | ICD-10-CM | POA: Diagnosis not present

## 2019-09-17 DIAGNOSIS — D509 Iron deficiency anemia, unspecified: Secondary | ICD-10-CM | POA: Diagnosis not present

## 2019-09-17 DIAGNOSIS — K6389 Other specified diseases of intestine: Secondary | ICD-10-CM | POA: Diagnosis not present

## 2019-09-17 HISTORY — PX: COLONOSCOPY: SHX174

## 2019-09-17 MED ORDER — SODIUM CHLORIDE 0.9 % IV SOLN: 500.0000 mL | Freq: Once | INTRAVENOUS | Status: DC

## 2019-09-17 NOTE — Patient Instructions (Signed)
INFORMATION ON DIVERTICULOSIS & HEMORRHOIDS GIVEN TO YOU TODAY  AWAIT BIOPSY RESULTS    YOU HAD AN ENDOSCOPIC PROCEDURE TODAY AT [facility]:   Refer to the procedure report that was given to you for any specific questions about what was found during the examination.  If the procedure report does not answer your questions, please call your gastroenterologist to clarify.  If you requested that your care partner not be given the details of your procedure findings, then the procedure report has been included in a sealed envelope for you to review at your convenience later.  YOU SHOULD EXPECT: Some feelings of bloating in the abdomen. Passage of more gas than usual.  Walking can help get rid of the air that was put into your GI tract during the procedure and reduce the bloating. If you had a lower endoscopy (such as a colonoscopy or flexible sigmoidoscopy) you may notice spotting of blood in your stool or on the toilet paper. If you underwent a bowel prep for your procedure, you may not have a normal bowel movement for a few days.  Please Note:  You might notice some irritation and congestion in your nose or some drainage.  This is from the oxygen used during your procedure.  There is no need for concern and it should clear up in a day or so.  SYMPTOMS TO REPORT IMMEDIATELY:   Following lower endoscopy (colonoscopy or flexible sigmoidoscopy):  Excessive amounts of blood in the stool  Significant tenderness or worsening of abdominal pains  Swelling of the abdomen that is new, acute  Fever of 100F or higher    For urgent or emergent issues, a gastroenterologist can be reached at any hour by calling [phone]. Do not use MyChart messaging for urgent concerns.    DIET:  We do recommend a small meal at first, but then you may proceed to your regular diet.  Drink plenty of fluids but you should avoid alcoholic beverages for 24 hours.  ACTIVITY:  You should plan to take it  easy for the rest of today and you should NOT DRIVE or use heavy machinery until tomorrow (because of the sedation medicines used during the test).    FOLLOW UP: Our staff will call the number listed on your records 48-72 hours following your procedure to check on you and address any questions or concerns that you may have regarding the information given to you following your procedure. If we do not reach you, we will leave a message.  We will attempt to reach you two times.  During this call, we will ask if you have developed any symptoms of COVID 19. If you develop any symptoms (ie: fever, flu-like symptoms, shortness of breath, cough etc.) before then, please call [phone].  If you test positive for Covid 19 in the 2 weeks post procedure, please call and report this information to [place].    If any biopsies were taken you will be contacted by phone or by letter within the next 1-3 weeks.  Please call us at [phone] if you have not heard about the biopsies in 3 weeks.    SIGNATURES/CONFIDENTIALITY: You and/or your care partner have signed paperwork which will be entered into your electronic medical record.  These signatures attest to the fact that that the information above on your After Visit Summary has been reviewed and is understood.  Full responsibility of the confidentiality of this discharge information lies with you and/or your care-partner.

## 2019-09-17 NOTE — Progress Notes (Signed)
Called to room to assist during endoscopic procedure.  Patient ID and intended procedure confirmed with present staff. Received instructions for my participation in the procedure from the performing physician.  

## 2019-09-17 NOTE — Progress Notes (Signed)
Pt's states no medical or surgical changes since previsit or office visit. 

## 2019-09-17 NOTE — Progress Notes (Signed)
Report given to PACU, vss 

## 2019-09-17 NOTE — Op Note (Signed)
[place] Patient Name: Micheal Hamilton Procedure Date: 09/17/2019 9:57 AM MRN: [id number] Endoscopist: Milus Banister , MD Age: 69 Referring MD:  Date of Birth: 10/29/1950 Gender: Male Account #: [id number] Procedure:                Colonoscopy Indications:              High risk colon cancer surveillance: Personal                            history of colonic polyps; Colonoscopy Dr. Amedeo Plenty                            2004. Small polyp, also diverticulosis pathology                            report shows TA was removed. He also did EGD,                            duodenum biopsied and it was normal (no villous                            atrophy). Described a 2-3cm HH; Colonoscopy Dr.                            Harrell Lark: pathology report from 10/2008:                            "tubular adenoma without HGD"; Colonoscopy Ardis Hughs                            10/2012 found internal and external hemorrhoids,                            diverticulum; also 24mm polyp (TA on pathology).                            Large hemorrhoids, offered but declined                            hemorrhoidal ligation in 2014 Medicines:                Monitored Anesthesia Care Procedure:                Pre-Anesthesia Assessment:                           - Prior to the procedure, a History and Physical                            was performed, and patient medications and                            allergies were reviewed. The patient's tolerance of  previous anesthesia was also reviewed. The risks                            and benefits of the procedure and the sedation                            options and risks were discussed with the patient.                            All questions were answered, and informed consent                            was obtained. Prior Anticoagulants: The patient has                            taken no previous anticoagulant or antiplatelet                             agents. ASA Grade Assessment: II - A patient with                            mild systemic disease. After reviewing the risks                            and benefits, the patient was deemed in                            satisfactory condition to undergo the procedure.                           After obtaining informed consent, the colonoscope                            was passed under direct vision. Throughout the                            procedure, the patient's blood pressure, pulse, and                            oxygen saturations were monitored continuously. The                            Colonoscope was introduced through the anus and                            advanced to the the cecum, identified by                            appendiceal orifice and ileocecal valve. The                            colonoscopy was performed without difficulty. The  patient tolerated the procedure well. The quality                            of the bowel preparation was good. The ileocecal                            valve, appendiceal orifice, and rectum were                            photographed. Scope In: 10:00:40 AM Scope Out: 10:18:44 AM Scope Withdrawal Time: 0 hours 15 minutes 44 seconds  Total Procedure Duration: 0 hours 18 minutes 4 seconds  Findings:                 Left sided colon diverticulosis.                           Fairly focal segment of abnormal, nodular,                            congested appearing mucosa in the proximal sigmoid.                            This was a very spastic segment containing several                            erythematous nodules measuring 4-6mm. One 'nodule'                            was quite a bit more congested than the others and                            may represent a very broad based polyp given the                            appearance of the 'tip.' Extensive biopsies were                             taken with a cold forceps for histology. I favor                            this region to be an inflammatory process (chronic                            diverticular, focal diverticular associated                            colitis, perhaps prolapse related changes given the                            noticable spasticty of the segment.                           Medium sized internal and external hemorrhoids.  The exam was otherwise without abnormality on                            direct and retroflexion views. Complications:            No immediate complications. Estimated blood loss:                            None. Estimated Blood Loss:     Estimated blood loss: none. Impression:               - Fairly focal segment of abnormal, nodular,                            congested appearing mucosa in the proximal sigmoid.                            This was a very spastic segment containing several                            erythematous nodules measuring 4-54mm. One 'nodule'                            was quite a bit more congested than the others and                            may represent a very broad based polyp given the                            appearance of the 'tip.' Extensive biopsies were                            taken with a cold forceps for histology. I favor                            this region to be an inflammatory process (chronic                            diverticular, focal diverticular associated                            colitis, perhaps prolapse related changes given the                            noticable spasticty of the segment.                           - Internal and external hemorrhoids.                           - The examination was otherwise normal on direct                            and retroflexion views. Recommendation:           -  Patient has a contact number available for                             emergencies. The signs and symptoms of potential                            delayed complications were discussed with the                            patient. Return to normal activities tomorrow.                            Written discharge instructions were provided to the                            patient.                           - Resume previous diet.                           - Continue present medications.                           - Await pathology results. Milus Banister, MD 09/17/2019 10:27:29 AM This report has been signed electronically.

## 2019-09-17 NOTE — Progress Notes (Signed)
   DR JACOBS INFORMED OF SLIGHT DISTENTION OF ABD ,PASSED  SMALL AMT OF FLATUS & SMALL AMT OF RECTAL BLEEDING ON TOILET TISSUE AFTER PASSED FLATUS IN BR. oK'D FOR D/C , PT WILL CALL IF UNCOMFORTABLE OR DOES NOT CONTINUE PASSING FLATUS

## 2019-09-21 ENCOUNTER — Telehealth: Payer: Self-pay

## 2019-09-21 NOTE — Telephone Encounter (Signed)
LVM

## 2019-09-22 ENCOUNTER — Encounter: Payer: Self-pay | Admitting: Gastroenterology

## 2020-02-28 ENCOUNTER — Other Ambulatory Visit: Payer: Self-pay | Admitting: Family Medicine

## 2020-04-01 DIAGNOSIS — R69 Illness, unspecified: Secondary | ICD-10-CM | POA: Diagnosis not present

## 2020-05-02 ENCOUNTER — Encounter: Payer: Self-pay | Admitting: Family Medicine

## 2020-05-03 NOTE — Telephone Encounter (Signed)
Set up an OV for me to examine his hands. We will get labs that same day (no need to fast)

## 2020-05-10 ENCOUNTER — Other Ambulatory Visit: Payer: Self-pay

## 2020-05-10 ENCOUNTER — Encounter: Payer: Self-pay | Admitting: Family Medicine

## 2020-05-10 ENCOUNTER — Ambulatory Visit (INDEPENDENT_AMBULATORY_CARE_PROVIDER_SITE_OTHER): Payer: Medicare HMO | Admitting: Family Medicine

## 2020-05-10 VITALS — BP 120/78 | HR 73 | Temp 98.1°F | Ht 69.0 in | Wt 218.4 lb

## 2020-05-10 DIAGNOSIS — M65322 Trigger finger, left index finger: Secondary | ICD-10-CM | POA: Diagnosis not present

## 2020-05-10 DIAGNOSIS — R739 Hyperglycemia, unspecified: Secondary | ICD-10-CM

## 2020-05-10 DIAGNOSIS — R972 Elevated prostate specific antigen [PSA]: Secondary | ICD-10-CM | POA: Diagnosis not present

## 2020-05-10 LAB — URINALYSIS
Bilirubin Urine: NEGATIVE
Hgb urine dipstick: NEGATIVE
Ketones, ur: NEGATIVE
Leukocytes,Ua: NEGATIVE
Nitrite: NEGATIVE
Specific Gravity, Urine: >=1.03 — AB (ref 1.000–1.030)
Total Protein, Urine: NEGATIVE
Urine Glucose: NEGATIVE
Urobilinogen, UA: 0.2 (ref 0.0–1.0)
pH: 6 (ref 5.0–8.0)

## 2020-05-10 LAB — PSA: PSA: 2.72 ng/mL (ref 0.10–4.00)

## 2020-05-10 LAB — HEMOGLOBIN A1C: Hgb A1c MFr Bld: 5.9 % (ref 4.6–6.5)

## 2020-05-10 NOTE — Progress Notes (Signed)
   Subjective:    Patient ID: Micheal Hamilton, male    DOB: 1950/12/11, 69 y.o.   MRN: [id number]  HPI Here for several issues. First he was here in February for a well exam, and his labs revealed a UTI and an elevated PSA to 7.10. We treated the UTI with 14 days of Doxycycline. We hoped the PSA was up as an effect of the UTI. He is here to recheck this. He has no urinary symptoms. Also for the last 2 weeks he has had pain in the left index finger and it seems to get stuck in a flexed position at times. No recent trauma.    Review of Systems  Constitutional: Negative.   Respiratory: Negative.   Cardiovascular: Negative.   Genitourinary: Negative.   Musculoskeletal: Positive for arthralgias.       Objective:   Physical Exam Constitutional:      Appearance: Normal appearance.  Cardiovascular:     Rate and Rhythm: Normal rate and regular rhythm.     Pulses: Normal pulses.     Heart sounds: Normal heart sounds.  Pulmonary:     Effort: Pulmonary effort is normal.     Breath sounds: Normal breath sounds.  Musculoskeletal:     Comments: Both hands reveal a lot of osteoarthritis with swollen PIPs and DIPs. The left index finger has a catch when flexing and extending at the PIP joint   Neurological:     Mental Status: He is alert.           Assessment & Plan:  Hx of UTI and elevated PSA. We will recheck a UA and PSA today. For the trigger finger, we will refer him to Hand Surgery. Alysia Penna, MD

## 2020-05-18 DIAGNOSIS — M65322 Trigger finger, left index finger: Secondary | ICD-10-CM | POA: Diagnosis not present

## 2020-05-25 ENCOUNTER — Telehealth: Payer: Self-pay | Admitting: Family Medicine

## 2020-05-25 DIAGNOSIS — H52222 Regular astigmatism, left eye: Secondary | ICD-10-CM | POA: Diagnosis not present

## 2020-05-25 NOTE — Telephone Encounter (Signed)
Left message for patient to call back and schedule Medicare Annual Wellness Visit (AWV) either virtually or in office.   Last AWV no information please schedule at anytime with [facility] Nurse Health Advisor 1 or 2   This should be a 45 minute visit. 

## 2020-08-02 ENCOUNTER — Telehealth: Payer: Self-pay | Admitting: Family Medicine

## 2020-08-02 NOTE — Telephone Encounter (Signed)
Left message for patient to call back and schedule Medicare Annual Wellness Visit (AWV) either virtually or in office. No detailed message   AWVI  please schedule at anytime with [facility] Nurse Health Advisor 1 or 2   This should be a 45 minute visit. 

## 2020-08-13 ENCOUNTER — Other Ambulatory Visit: Payer: Self-pay | Admitting: Family Medicine

## 2020-08-18 NOTE — Progress Notes (Signed)
Subjective:   Micheal Hamilton is a 70 y.o. male who presents for an Initial Medicare Annual Wellness Visit.  I connected with Lisle Skillman today by telephone and verified that I am speaking with the correct person using two identifiers. Location patient: home Location provider: work Persons participating in the virtual visit: patient, provider.   I discussed the limitations, risks, security and privacy concerns of performing an evaluation and management service by telephone and the availability of in person appointments. I also discussed with the patient that there may be a patient responsible charge related to this service. The patient expressed understanding and verbally consented to this telephonic visit.    Interactive audio and video telecommunications were attempted between this provider and patient, however failed, due to patient having technical difficulties OR patient did not have access to video capability.  We continued and completed visit with audio only.      Review of Systems    N/A  Cardiac Risk Factors include: advanced age (>51men, >2 women);male gender;dyslipidemia     Objective:    Today's Vitals   There is no height or weight on file to calculate BMI.  Advanced Directives 08/27/2013  Does Patient Have a Medical Advance Directive? Patient has advance directive, copy not in chart  Type of Advance Directive Living will  Does patient want to make changes to medical advance directive? No change requested    Current Medications (verified) Outpatient Encounter Medications as of 08/21/2020  Medication Sig   diclofenac (VOLTAREN) 75 MG EC tablet TAKE 1 TABLET BY MOUTH TWICE A DAY   ferrous sulfate 325 (65 FE) MG tablet TAKE 1 TABLET BY MOUTH THREE TIMES A DAY WITH MEALS   mometasone (ELOCON) 0.1 % cream Apply 1 application topically 2 (two) times daily as needed.   vitamin C (ASCORBIC ACID) 250 MG tablet Take 1 tablet (250 mg total) by mouth 3 (three)  times daily.   Vitamin D, Ergocalciferol, (DRISDOL) 1.25 MG (50000 UNIT) CAPS capsule TAKE 1 CAPSULE BY MOUTH ONE TIME PER WEEK   zinc gluconate 50 MG tablet Take 50 mg by mouth daily.   meclizine (ANTIVERT) 25 MG tablet Take 1 tablet (25 mg total) by mouth every 4 (four) hours as needed for dizziness. (Patient not taking: No sig reported)   [DISCONTINUED] doxycycline (VIBRA-TABS) 100 MG tablet Take 1 tablet (100 mg total) by mouth 2 (two) times daily. (Patient not taking: Reported on 05/10/2020)   No facility-administered encounter medications on file as of 08/21/2020.    Allergies (verified) Crestor [rosuvastatin]   History: Past Medical History:  Diagnosis Date   Allergy    occ seasonally    Arthritis    Colon polyps    hx of   Diverticulosis    GERD (gastroesophageal reflux disease)    Hyperlipidemia    Iron deficiency anemia    presumed from hemorrhoids which bleed daily (GI consult 2014)   Obesity    Pre-diabetes    no medicines    Past Surgical History:  Procedure Laterality Date   BAND HEMORRHOIDECTOMY     COLONOSCOPY  10/14/2012   per Dr. Ardis Hughs, adenomatous polyps, repeat in 5 yrs    edg  2004   Dr Amedeo Plenty- hiatal hernia   POLYPECTOMY     TONSILLECTOMY     UPPER GASTROINTESTINAL ENDOSCOPY     Family History  Problem Relation Age of Onset   Lung cancer Father    Cancer Father  lung   Crohn's disease Sister    Diabetes Cousin    Heart disease Maternal Uncle    Colon cancer Neg Hx    Colon polyps Neg Hx    Esophageal cancer Neg Hx    Stomach cancer Neg Hx    Rectal cancer Neg Hx    Social History   Socioeconomic History   Marital status: Married    Spouse name: Not on file   Number of children: 3   Years of education: Not on file   Highest education level: Not on file  Occupational History   Occupation: [employer]    Comment: Audiological scientist  Tobacco Use   Smoking status: Former Smoker     Packs/day: 2.50    Years: 25.00    Pack years: 62.50    Types: Cigarettes    Start date: 03/10/1964    Quit date: 06/10/1988    Years since quitting: 32.2   Smokeless tobacco: Never Used   Tobacco comment: quit 25 years ago  Substance and Sexual Activity   Alcohol use: Yes   Drug use: No   Sexual activity: Not on file  Other Topics Concern   Not on file  Social History Narrative   Regular exercise- No   Social Determinants of Health   Financial Resource Strain: Low Risk    Difficulty of Paying Living Expenses: Not hard at all  Food Insecurity: No Food Insecurity   Worried About Charity fundraiser in the Last Year: Never true   [place] in the Last Year: Never true  Transportation Needs: No Transportation Needs   Lack of Transportation (Medical): No   Lack of Transportation (Non-Medical): No  Physical Activity: Inactive   Days of Exercise per Week: 0 days   Minutes of Exercise per Session: 0 min  Stress: No Stress Concern Present   Feeling of Stress : Not at all  Social Connections: Moderately Isolated   Frequency of Communication with Friends and Family: More than three times a week   Frequency of Social Gatherings with Friends and Family: Three times a week   Attends Religious Services: Never   Active Member of Clubs or Organizations: No   Attends Music therapist: Never   Marital Status: Married    Tobacco Counseling Counseling given: Not Answered Comment: quit 25 years ago   Clinical Intake:  Pre-visit preparation completed: Yes  Pain : No/denies pain     Nutritional Risks: None Diabetes: No  How often do you need to have someone help you when you read instructions, pamphlets, or other written materials from your doctor or pharmacy?: 1 - Never  Diabetic?No  Interpreter Needed?: No  Information entered by :: [place] of Daily Living In your present state of health, do you have any difficulty  performing the following activities: 08/21/2020  Hearing? Y  Comment had hearing aids in the past  Vision? N  Difficulty concentrating or making decisions? N  Walking or climbing stairs? N  Dressing or bathing? N  Doing errands, shopping? N  Preparing Food and eating ? N  Using the Toilet? N  In the past six months, have you accidently leaked urine? N  Do you have problems with loss of bowel control? N  Managing your Medications? N  Managing your Finances? N  Housekeeping or managing your Housekeeping? N  Some recent data might be hidden    Patient Care Team: Laurey Morale, MD as PCP -  General (Family Medicine)  Indicate any recent Medical Services you may have received from other than [facility] providers in the past year (date may be approximate).     Assessment:   This is a routine wellness examination for Little.  Hearing/Vision screen  Hearing Screening   125Hz  250Hz  500Hz  1000Hz  2000Hz  3000Hz  4000Hz  6000Hz  8000Hz   Right ear:           Left ear:           Vision Screening Comments: States gets eye exams every 1-2 years. Wears reading glasses    Dietary issues and exercise activities discussed: Current Exercise Habits: The patient has a physically strenuous job, but has no regular exercise apart from work.  Goals     Patient Stated     I want to age gracefully       Depression Screen PHQ 2/9 Scores 08/21/2020 05/10/2020 04/28/2018 02/19/2018 10/30/2016  PHQ - 2 Score 0 0 0 2 0    Fall Risk Fall Risk  08/21/2020 05/10/2020 04/28/2018 02/19/2018 10/30/2016  Falls in the past year? 0 0 0 No No  Number falls in past yr: 0 0 - - -  Injury with Fall? 0 0 - - -  Risk for fall due to : No Fall Risks - - - -  Follow up Falls evaluation completed;Falls prevention discussed - - - -    FALL RISK PREVENTION PERTAINING TO THE HOME:  Any stairs in or around the home? Yes  If so, are there any without handrails? No  Home free of loose throw rugs in walkways, pet beds, electrical  cords, etc? Yes  Adequate lighting in your home to reduce risk of falls? Yes   ASSISTIVE DEVICES UTILIZED TO PREVENT FALLS:  Life alert? No  Use of a cane, walker or w/c? No  Grab bars in the bathroom? Yes  Shower chair or bench in shower? No  Elevated toilet seat or a handicapped toilet? Yes     Cognitive Function:   Normal cognitive status assessed by direct observation by this Nurse Health Advisor. No abnormalities found.        Immunizations Immunization History  Administered Date(s) Administered   Fluad Quad(high Dose 65+) 04/01/2020   Influenza, High Dose Seasonal PF 05/01/2019, 05/04/2019   Influenza,inj,Quad PF,6+ Mos 02/27/2015, 03/11/2016   PFIZER(Purple Top)SARS-COV-2 Vaccination 07/15/2019, 08/09/2019   Tdap 02/27/2015, 03/15/2018   Zoster Recombinat (Shingrix) 08/10/2017, 12/21/2017    TDAP status: Up to date  Flu Vaccine status: Up to date  Pneumococcal vaccine status: Up to date  Covid-19 vaccine status: Completed vaccines  Qualifies for Shingles Vaccine? Yes   Zostavax completed No   Shingrix Completed?: Yes  Screening Tests Health Maintenance  Topic Date Due   Hepatitis C Screening  Never done   PNA vac Low Risk Adult (1 of 2 - PCV13) Never done   COVID-19 Vaccine (3 - Booster for Pfizer series) 02/09/2020   TETANUS/TDAP  03/15/2028   COLONOSCOPY (Pts 45-85yrs Insurance coverage will need to be confirmed)  09/16/2029   INFLUENZA VACCINE  Completed   HPV VACCINES  Aged Out    Health Maintenance  Health Maintenance Due  Topic Date Due   Hepatitis C Screening  Never done   PNA vac Low Risk Adult (1 of 2 - PCV13) Never done   COVID-19 Vaccine (3 - Booster for Pfizer series) 02/09/2020    Colorectal cancer screening: Type of screening: Colonoscopy. Completed 09/17/2019. Repeat every 10 years  Lung Cancer Screening: (  Low Dose CT Chest recommended if Age 51-80 years, 30 pack-year currently smoking OR have quit w/in 15years.)  does not qualify.   Lung Cancer Screening Referral: N/a  Additional Screening:  Hepatitis C Screening: does qualify;   Vision Screening: Recommended annual ophthalmology exams for early detection of glaucoma and other disorders of the eye. Is the patient up to date with their annual eye exam?  No  Who is the provider or what is the name of the office in which the patient attends annual eye exams? [facility]  If pt is not established with a provider, would they like to be referred to a provider to establish care? No .   Dental Screening: Recommended annual dental exams for proper oral hygiene  Community Resource Referral / Chronic Care Management: CRR required this visit?  No   CCM required this visit?  No      Plan:     I have personally reviewed and noted the following in the patients chart:    Medical and social history  Use of alcohol, tobacco or illicit drugs   Current medications and supplements  Functional ability and status  Nutritional status  Physical activity  Advanced directives  List of other physicians  Hospitalizations, surgeries, and ER visits in previous 12 months  Vitals  Screenings to include cognitive, depression, and falls  Referrals and appointments  In addition, I have reviewed and discussed with patient certain preventive protocols, quality metrics, and best practice recommendations. A written personalized care plan for preventive services as well as general preventive health recommendations were provided to patient.     Ofilia Neas, LPN   [id number]   Nurse Notes: None

## 2020-08-21 ENCOUNTER — Ambulatory Visit (INDEPENDENT_AMBULATORY_CARE_PROVIDER_SITE_OTHER): Payer: Medicare Other

## 2020-08-21 DIAGNOSIS — Z0000 Encounter for general adult medical examination without abnormal findings: Secondary | ICD-10-CM

## 2020-08-21 NOTE — Patient Instructions (Signed)
[name] , Thank you for taking time to come for your Medicare Wellness Visit. I appreciate your ongoing commitment to your health goals. Please review the following plan we discussed and let me know if I can assist you in the future.   Screening recommendations/referrals: Colonoscopy: Up to date, next due 09/16/2029 Recommended yearly ophthalmology/optometry visit for glaucoma screening and checkup Recommended yearly dental visit for hygiene and checkup  Vaccinations: Influenza vaccine: Up to date, next due fall 2022  Pneumococcal vaccine: Currently due, you may receive the first dose at your next in person office visit. Tdap vaccine: Up to date, next due 03/15/2028 Shingles vaccine: Completed series     Advanced directives: Please bring in copies of your advanced medical directives so that we may scan them into your chart.   Conditions/risks identified: None   Next appointment: None   Preventive Care 65 Years and Older, Male Preventive care refers to lifestyle choices and visits with your health care provider that can promote health and wellness. What does preventive care include?  A yearly physical exam. This is also called an annual well check.  Dental exams once or twice a year.  Routine eye exams. Ask your health care provider how often you should have your eyes checked.  Personal lifestyle choices, including:  Daily care of your teeth and gums.  Regular physical activity.  Eating a healthy diet.  Avoiding tobacco and drug use.  Limiting alcohol use.  Practicing safe sex.  Taking low doses of aspirin every day.  Taking vitamin and mineral supplements as recommended by your health care provider. What happens during an annual well check? The services and screenings done by your health care provider during your annual well check will depend on your age, overall health, lifestyle risk factors, and family history of disease. Counseling  Your health care provider  may ask you questions about your:  Alcohol use.  Tobacco use.  Drug use.  Emotional well-being.  Home and relationship well-being.  Sexual activity.  Eating habits.  History of falls.  Memory and ability to understand (cognition).  Work and work Statistician. Screening  You may have the following tests or measurements:  Height, weight, and BMI.  Blood pressure.  Lipid and cholesterol levels. These may be checked every 5 years, or more frequently if you are over 48 years old.  Skin check.  Lung cancer screening. You may have this screening every year starting at age 66 if you have a 30-pack-year history of smoking and currently smoke or have quit within the past 15 years.  Fecal occult blood test (FOBT) of the stool. You may have this test every year starting at age 87.  Flexible sigmoidoscopy or colonoscopy. You may have a sigmoidoscopy every 5 years or a colonoscopy every 10 years starting at age 98.  Prostate cancer screening. Recommendations will vary depending on your family history and other risks.  Hepatitis C blood test.  Hepatitis B blood test.  Sexually transmitted disease (STD) testing.  Diabetes screening. This is done by checking your blood sugar (glucose) after you have not eaten for a while (fasting). You may have this done every 1-3 years.  Abdominal aortic aneurysm (AAA) screening. You may need this if you are a current or former smoker.  Osteoporosis. You may be screened starting at age 76 if you are at high risk. Talk with your health care provider about your test results, treatment options, and if necessary, the need for more tests. Vaccines  Your  health care provider may recommend certain vaccines, such as:  Influenza vaccine. This is recommended every year.  Tetanus, diphtheria, and acellular pertussis (Tdap, Td) vaccine. You may need a Td booster every 10 years.  Zoster vaccine. You may need this after age 40.  Pneumococcal 13-valent  conjugate (PCV13) vaccine. One dose is recommended after age 41.  Pneumococcal polysaccharide (PPSV23) vaccine. One dose is recommended after age 13. Talk to your health care provider about which screenings and vaccines you need and how often you need them. This information is not intended to replace advice given to you by your health care provider. Make sure you discuss any questions you have with your health care provider. Document Released: 06/23/2015 Document Revised: 02/14/2016 Document Reviewed: 03/28/2015 Elsevier Interactive Patient Education  2017 [place] Prevention in the Home Falls can cause injuries. They can happen to people of all ages. There are many things you can do to make your home safe and to help prevent falls. What can I do on the outside of my home?  Regularly fix the edges of walkways and driveways and fix any cracks.  Remove anything that might make you trip as you walk through a door, such as a raised step or threshold.  Trim any bushes or trees on the path to your home.  Use bright outdoor lighting.  Clear any walking paths of anything that might make someone trip, such as rocks or tools.  Regularly check to see if handrails are loose or broken. Make sure that both sides of any steps have handrails.  Any raised decks and porches should have guardrails on the edges.  Have any leaves, snow, or ice cleared regularly.  Use sand or salt on walking paths during winter.  Clean up any spills in your garage right away. This includes oil or grease spills. What can I do in the bathroom?  Use night lights.  Install grab bars by the toilet and in the tub and shower. Do not use towel bars as grab bars.  Use non-skid mats or decals in the tub or shower.  If you need to sit down in the shower, use a plastic, non-slip stool.  Keep the floor dry. Clean up any water that spills on the floor as soon as it happens.  Remove soap buildup in the tub or  shower regularly.  Attach bath mats securely with double-sided non-slip rug tape.  Do not have throw rugs and other things on the floor that can make you trip. What can I do in the bedroom?  Use night lights.  Make sure that you have a light by your bed that is easy to reach.  Do not use any sheets or blankets that are too big for your bed. They should not hang down onto the floor.  Have a firm chair that has side arms. You can use this for support while you get dressed.  Do not have throw rugs and other things on the floor that can make you trip. What can I do in the kitchen?  Clean up any spills right away.  Avoid walking on wet floors.  Keep items that you use a lot in easy-to-reach places.  If you need to reach something above you, use a strong step stool that has a grab bar.  Keep electrical cords out of the way.  Do not use floor polish or wax that makes floors slippery. If you must use wax, use non-skid floor wax.  Do not  have throw rugs and other things on the floor that can make you trip. What can I do with my stairs?  Do not leave any items on the stairs.  Make sure that there are handrails on both sides of the stairs and use them. Fix handrails that are broken or loose. Make sure that handrails are as long as the stairways.  Check any carpeting to make sure that it is firmly attached to the stairs. Fix any carpet that is loose or worn.  Avoid having throw rugs at the top or bottom of the stairs. If you do have throw rugs, attach them to the floor with carpet tape.  Make sure that you have a light switch at the top of the stairs and the bottom of the stairs. If you do not have them, ask someone to add them for you. What else can I do to help prevent falls?  Wear shoes that:  Do not have high heels.  Have rubber bottoms.  Are comfortable and fit you well.  Are closed at the toe. Do not wear sandals.  If you use a stepladder:  Make sure that it is fully  opened. Do not climb a closed stepladder.  Make sure that both sides of the stepladder are locked into place.  Ask someone to hold it for you, if possible.  Clearly mark and make sure that you can see:  Any grab bars or handrails.  First and last steps.  Where the edge of each step is.  Use tools that help you move around (mobility aids) if they are needed. These include:  Canes.  Walkers.  Scooters.  Crutches.  Turn on the lights when you go into a dark area. Replace any light bulbs as soon as they burn out.  Set up your furniture so you have a clear path. Avoid moving your furniture around.  If any of your floors are uneven, fix them.  If there are any pets around you, be aware of where they are.  Review your medicines with your doctor. Some medicines can make you feel dizzy. This can increase your chance of falling. Ask your doctor what other things that you can do to help prevent falls. This information is not intended to replace advice given to you by your health care provider. Make sure you discuss any questions you have with your health care provider. Document Released: 03/23/2009 Document Revised: 11/02/2015 Document Reviewed: 07/01/2014 Elsevier Interactive Patient Education  2017 [employer].

## 2020-10-14 ENCOUNTER — Other Ambulatory Visit: Payer: Self-pay | Admitting: Family Medicine

## 2020-10-18 ENCOUNTER — Encounter: Payer: Self-pay | Admitting: Family Medicine

## 2021-03-03 ENCOUNTER — Other Ambulatory Visit: Payer: Self-pay | Admitting: Family Medicine

## 2021-03-05 NOTE — Telephone Encounter (Signed)
Please advise.  Levels have not been checked in over a year. Ok to fill?

## 2021-08-29 DIAGNOSIS — H9313 Tinnitus, bilateral: Secondary | ICD-10-CM | POA: Diagnosis not present

## 2021-08-29 DIAGNOSIS — H903 Sensorineural hearing loss, bilateral: Secondary | ICD-10-CM | POA: Diagnosis not present

## 2021-10-03 ENCOUNTER — Ambulatory Visit (INDEPENDENT_AMBULATORY_CARE_PROVIDER_SITE_OTHER): Payer: Medicare Other | Admitting: Family Medicine

## 2021-10-03 ENCOUNTER — Encounter: Payer: Self-pay | Admitting: Family Medicine

## 2021-10-03 ENCOUNTER — Telehealth: Payer: Self-pay | Admitting: Family Medicine

## 2021-10-03 VITALS — BP 128/80 | HR 101 | Temp 100.5°F | Wt 209.0 lb

## 2021-10-03 DIAGNOSIS — R051 Acute cough: Secondary | ICD-10-CM

## 2021-10-03 DIAGNOSIS — A491 Streptococcal infection, unspecified site: Secondary | ICD-10-CM | POA: Diagnosis not present

## 2021-10-03 DIAGNOSIS — R509 Fever, unspecified: Secondary | ICD-10-CM

## 2021-10-03 LAB — POC COVID19 BINAXNOW: SARS Coronavirus 2 Ag: NEGATIVE

## 2021-10-03 LAB — POCT RAPID STREP A (OFFICE): Rapid Strep A Screen: POSITIVE — AB

## 2021-10-03 MED ORDER — AZITHROMYCIN 250 MG PO TABS: ORAL_TABLET | ORAL | 0 refills | Status: DC

## 2021-10-03 MED ORDER — DIAZEPAM 5 MG PO TABS: 5.0000 mg | ORAL_TABLET | Freq: Three times a day (TID) | ORAL | 1 refills | Status: DC | PRN

## 2021-10-03 NOTE — Progress Notes (Signed)
? ?  Subjective:  ? ? Patient ID: ANTERRIO MCCLEERY, male    DOB: 1951-01-19, 71 y.o.   MRN: [id number] ? ?HPI ?Here for 3 days of fever, chest congestion, and a dry cough. No ST or NVD. Drinking fluids.  ? ? ?Review of Systems  ?Constitutional:  Positive for fatigue and fever.  ?Respiratory:  Positive for cough and chest tightness. Negative for shortness of breath and wheezing.   ?Cardiovascular: Negative.   ? ?   ?Objective:  ? Physical Exam ?Constitutional:   ?   Appearance: Normal appearance.  ?HENT:  ?   Right Ear: Tympanic membrane, ear canal and external ear normal.  ?   Left Ear: Tympanic membrane, ear canal and external ear normal.  ?   Nose: Nose normal.  ?   Mouth/Throat:  ?   Pharynx: Oropharynx is clear.  ?Eyes:  ?   Conjunctiva/sclera: Conjunctivae normal.  ?Pulmonary:  ?   Effort: Pulmonary effort is normal.  ?   Breath sounds: Normal breath sounds.  ?Lymphadenopathy:  ?   Cervical: No cervical adenopathy.  ?Neurological:  ?   Mental Status: He is alert.  ? ? ? ? ? ?   ?Assessment & Plan:  ?Strep infection, treat with a Zpack. Add Nyquil as needed.  ?Alysia Penna, MD ? ? ?

## 2021-10-03 NOTE — Telephone Encounter (Signed)
Patient calling in with respiratory symptoms: ?Shortness of breath, chest pain, palpitations or other red words send to Triage ? ?Does the patient have a fever over 100, cough, congestion, sore throat, runny nose, lost of taste/smell (please list symptoms that patient has)?cough ? ?What date did symptoms start?less than 5 days ?(If over 5 days ago, pt may be scheduled for in person visit) ? ?Have you tested for Covid in the last 5 days? No  ? ?If yes, was it positive '[]'$  OR negative '[]'$ ? If positive in the last 5 days, please schedule virtual visit now. If negative, schedule for an in person OV with the next available provider if PCP has no openings. Please also let patient know they will be tested again (follow the script below) ? ?"you will have to arrive 27mns prior to your appt time to be Covid tested. Please [facility] in back [facility] & call [phone] let the staff know you have arrived. A staff member will meet you at your car to do a rapid covid test. Once the test has resulted you will be notified by phone of your results to determine if appt will remain an in person visit or be converted to a virtual/phone visit. If you arrive less than 376ms before your appt time, your visit will be automatically converted to virtual & any recommended testing will happen AFTER the visit." ?Pt has an appt with dr fry 10-03-2021 2 pm appt ? ?THINGS TO REMEMBER ? ?If no availability for virtual visit in office,  please schedule another  office ? ?If no availability at another [facility], please instruct patient that they can schedule an evisit or virtual visit through their mychart account. Visits up to 8pm ? ?patients can be seen in office 5 days after positive COVID test ? ?  ?

## 2021-10-04 ENCOUNTER — Encounter: Payer: Medicare Other | Admitting: Family Medicine

## 2021-10-05 ENCOUNTER — Telehealth: Payer: Self-pay | Admitting: Family Medicine

## 2021-10-05 NOTE — Telephone Encounter (Signed)
Pt had a visit on 09/02/21, please advise ?

## 2021-10-05 NOTE — Telephone Encounter (Signed)
Pt was prescribed azithromycin (ZITHROMAX Z-PAK) 250 MG tablet on 10/03/21 and startes it isn't helping. Previous symptoms remain, no energy. Requesting a prescription for something else. Please advise via phone call ?[id number]:  [phone] ?Fax:  [phone] ?  ? ?

## 2021-10-05 NOTE — Telephone Encounter (Signed)
Noted. Call in Augmentin 875 BID for 10 days  ?

## 2021-10-08 ENCOUNTER — Other Ambulatory Visit: Payer: Self-pay

## 2021-10-08 ENCOUNTER — Telehealth: Payer: Self-pay | Admitting: Family Medicine

## 2021-10-08 DIAGNOSIS — R051 Acute cough: Secondary | ICD-10-CM

## 2021-10-08 MED ORDER — AMOXICILLIN-POT CLAVULANATE 875-125 MG PO TABS: 1.0000 | ORAL_TABLET | Freq: Two times a day (BID) | ORAL | 0 refills | Status: DC

## 2021-10-08 NOTE — Telephone Encounter (Signed)
Please advise 

## 2021-10-08 NOTE — Telephone Encounter (Signed)
Pt is aware provider with patient pt was seen on 10-03-2021 with sore throat and requesting stronger medication ?

## 2021-10-08 NOTE — Telephone Encounter (Signed)
Spoke with patient.   [facility]in 875 mg [facility] to [facility] [address].  ?

## 2021-10-08 NOTE — Telephone Encounter (Signed)
Patient called in wanteing to speak with CMA for Dr Sarajane Jews. Regarding his strep throat medication .Marland Kitchen Patient is requesting a medication change and would like to speak to CMA  ?

## 2021-10-09 NOTE — Telephone Encounter (Signed)
Call in Augmentin 875 BID for 10 days  

## 2021-10-09 NOTE — Telephone Encounter (Signed)
Spoke with patient. Message complete.   See 10/05/21, phone note.  ?

## 2021-11-19 ENCOUNTER — Telehealth: Payer: Self-pay | Admitting: Family Medicine

## 2021-11-19 NOTE — Telephone Encounter (Signed)
Left message for patient to call back and schedule Medicare Annual Wellness Visit (AWV) either virtually or in office. Left  my jabber number [phone]   Last AWV 08/21/20  please schedule at anytime with [facility] Nurse Health Advisor 1 or 2    

## 2022-01-02 ENCOUNTER — Encounter: Payer: Self-pay | Admitting: Family Medicine

## 2022-01-02 ENCOUNTER — Ambulatory Visit (INDEPENDENT_AMBULATORY_CARE_PROVIDER_SITE_OTHER): Payer: Medicare Other | Admitting: Family Medicine

## 2022-01-02 VITALS — BP 110/70 | HR 67 | Temp 98.1°F | Ht 68.25 in | Wt 209.0 lb

## 2022-01-02 DIAGNOSIS — F418 Other specified anxiety disorders: Secondary | ICD-10-CM

## 2022-01-02 DIAGNOSIS — E559 Vitamin D deficiency, unspecified: Secondary | ICD-10-CM

## 2022-01-02 DIAGNOSIS — D509 Iron deficiency anemia, unspecified: Secondary | ICD-10-CM | POA: Diagnosis not present

## 2022-01-02 DIAGNOSIS — N401 Enlarged prostate with lower urinary tract symptoms: Secondary | ICD-10-CM | POA: Diagnosis not present

## 2022-01-02 DIAGNOSIS — N138 Other obstructive and reflux uropathy: Secondary | ICD-10-CM | POA: Diagnosis not present

## 2022-01-02 DIAGNOSIS — K219 Gastro-esophageal reflux disease without esophagitis: Secondary | ICD-10-CM | POA: Diagnosis not present

## 2022-01-02 DIAGNOSIS — E782 Mixed hyperlipidemia: Secondary | ICD-10-CM | POA: Diagnosis not present

## 2022-01-02 DIAGNOSIS — R7303 Prediabetes: Secondary | ICD-10-CM

## 2022-01-02 LAB — VITAMIN D 25 HYDROXY (VIT D DEFICIENCY, FRACTURES): VITD: 30.02 ng/mL (ref 30.00–100.00)

## 2022-01-02 LAB — LIPID PANEL
Cholesterol: 270 mg/dL — ABNORMAL HIGH (ref 0–200)
HDL: 35.7 mg/dL — ABNORMAL LOW (ref >39.00)
NonHDL: 234.07
Total CHOL/HDL Ratio: 8
Triglycerides: 312 mg/dL — ABNORMAL HIGH (ref 0.0–149.0)
VLDL: 62.4 mg/dL — ABNORMAL HIGH (ref 0.0–40.0)

## 2022-01-02 LAB — HEMOGLOBIN A1C: Hgb A1c MFr Bld: 5.7 % (ref 4.6–6.5)

## 2022-01-02 LAB — CBC WITH DIFFERENTIAL/PLATELET
Basophils Absolute: 0 10*3/uL (ref 0.0–0.1)
Basophils Relative: 0.5 % (ref 0.0–3.0)
Eosinophils Absolute: 0.2 10*3/uL (ref 0.0–0.7)
Eosinophils Relative: 2.3 % (ref 0.0–5.0)
HCT: 51.7 % (ref 39.0–52.0)
Hemoglobin: 16.7 g/dL (ref 13.0–17.0)
Lymphocytes Relative: 18.7 % (ref 12.0–46.0)
Lymphs Abs: 1.8 10*3/uL (ref 0.7–4.0)
MCHC: 32.3 g/dL (ref 30.0–36.0)
MCV: 81.3 fl (ref 78.0–100.0)
Monocytes Absolute: 1 10*3/uL (ref 0.1–1.0)
Monocytes Relative: 10.2 % (ref 3.0–12.0)
Neutro Abs: 6.4 10*3/uL (ref 1.4–7.7)
Neutrophils Relative %: 68.3 % (ref 43.0–77.0)
Platelets: 315 10*3/uL (ref 150.0–400.0)
RBC: 6.36 Mil/uL — ABNORMAL HIGH (ref 4.22–5.81)
RDW: 16.9 % — ABNORMAL HIGH (ref 11.5–15.5)
WBC: 9.4 10*3/uL (ref 4.0–10.5)

## 2022-01-02 LAB — HEPATIC FUNCTION PANEL
ALT: 12 U/L (ref 0–53)
AST: 17 U/L (ref 0–37)
Albumin: 4.6 g/dL (ref 3.5–5.2)
Alkaline Phosphatase: 58 U/L (ref 39–117)
Bilirubin, Direct: 0.1 mg/dL (ref 0.0–0.3)
Total Bilirubin: 0.9 mg/dL (ref 0.2–1.2)
Total Protein: 7.5 g/dL (ref 6.0–8.3)

## 2022-01-02 LAB — BASIC METABOLIC PANEL
BUN: 12 mg/dL (ref 6–23)
CO2: 30 mEq/L (ref 19–32)
Calcium: 9.8 mg/dL (ref 8.4–10.5)
Chloride: 101 mEq/L (ref 96–112)
Creatinine, Ser: 0.86 mg/dL (ref 0.40–1.50)
GFR: 87.71 mL/min (ref >60.00)
Glucose, Bld: 83 mg/dL (ref 70–99)
Potassium: 4.3 mEq/L (ref 3.5–5.1)
Sodium: 138 mEq/L (ref 135–145)

## 2022-01-02 LAB — LDL CHOLESTEROL, DIRECT: Direct LDL: 188 mg/dL

## 2022-01-02 LAB — PSA: PSA: 2.83 ng/mL (ref 0.10–4.00)

## 2022-01-02 LAB — IBC + FERRITIN
Ferritin: 72.8 ng/mL (ref 22.0–322.0)
Iron: 107 ug/dL (ref 42–165)
Saturation Ratios: 28.1 % (ref 20.0–50.0)
TIBC: 380.8 ug/dL (ref 250.0–450.0)
Transferrin: 272 mg/dL (ref 212.0–360.0)

## 2022-01-02 LAB — TSH: TSH: 1.51 u[IU]/mL (ref 0.35–5.50)

## 2022-01-02 LAB — IRON: Iron: 107 ug/dL (ref 42–165)

## 2022-01-02 MED ORDER — DICLOFENAC SODIUM 75 MG PO TBEC: 75.0000 mg | DELAYED_RELEASE_TABLET | Freq: Two times a day (BID) | ORAL | 3 refills | Status: DC

## 2022-01-02 MED ORDER — FERROUS SULFATE 325 (65 FE) MG PO TABS
325.0000 mg | ORAL_TABLET | Freq: Three times a day (TID) | ORAL | 3 refills | Status: DC
Start: 2022-01-02 — End: 2023-01-06

## 2022-01-02 MED ORDER — DIAZEPAM 5 MG PO TABS: 5.0000 mg | ORAL_TABLET | Freq: Three times a day (TID) | ORAL | 5 refills | Status: DC | PRN

## 2022-01-02 MED ORDER — SERTRALINE HCL 50 MG PO TABS: 50.0000 mg | ORAL_TABLET | Freq: Every day | ORAL | 3 refills | Status: DC

## 2022-01-02 NOTE — Progress Notes (Signed)
Subjective:    Patient ID: Micheal Hamilton, male    DOB: Nov 20, 1950, 71 y.o.   MRN: [id number]  HPI Here to follow up on issues. He feels well physically, but he has been dealing with a lot of stress since his wife's diagnosis of metastatic lung cancer. He feels sad and depressed, and he has low energy. No sleep or appetite issues. He has Valium to use for anxiety, but he asks to try Zoloft for the depression. Otherwise his GERD is stable, and his BPH is stable.    Review of Systems  Constitutional: Negative.   HENT: Negative.    Eyes: Negative.   Respiratory: Negative.    Cardiovascular: Negative.   Gastrointestinal: Negative.   Genitourinary: Negative.   Musculoskeletal: Negative.   Skin: Negative.   Neurological: Negative.   Psychiatric/Behavioral:  Positive for dysphoric mood. Negative for self-injury, sleep disturbance and suicidal ideas. The patient is nervous/anxious.        Objective:   Physical Exam Constitutional:      General: He is not in acute distress.    Appearance: Normal appearance. He is well-developed. He is not diaphoretic.  HENT:     Head: Normocephalic and atraumatic.     Right Ear: External ear normal.     Left Ear: External ear normal.     Nose: Nose normal.     Mouth/Throat:     Pharynx: No oropharyngeal exudate.  Eyes:     General: No scleral icterus.       Right eye: No discharge.        Left eye: No discharge.     Conjunctiva/sclera: Conjunctivae normal.     Pupils: Pupils are equal, round, and reactive to light.  Neck:     Thyroid: No thyromegaly.     Vascular: No JVD.     Trachea: No tracheal deviation.  Cardiovascular:     Rate and Rhythm: Normal rate and regular rhythm.     Heart sounds: Normal heart sounds. No murmur heard.    No friction rub. No gallop.  Pulmonary:     Effort: Pulmonary effort is normal. No respiratory distress.     Breath sounds: Normal breath sounds. No wheezing or rales.  Chest:     Chest wall: No  tenderness.  Abdominal:     General: Bowel sounds are normal. There is no distension.     Palpations: Abdomen is soft. There is no mass.     Tenderness: There is no abdominal tenderness. There is no guarding or rebound.  Genitourinary:    Penis: Normal. No tenderness.      Testes: Normal.     Prostate: Normal.     Rectum: Normal. Guaiac result negative.  Musculoskeletal:        General: No tenderness. Normal range of motion.     Cervical back: Neck supple.  Lymphadenopathy:     Cervical: No cervical adenopathy.  Skin:    General: Skin is warm and dry.     Coloration: Skin is not pale.     Findings: No erythema or rash.  Neurological:     Mental Status: He is alert and oriented to person, place, and time.     Cranial Nerves: No cranial nerve deficit.     Motor: No abnormal muscle tone.     Coordination: Coordination normal.     Deep Tendon Reflexes: Reflexes are normal and symmetric. Reflexes normal.  Psychiatric:        Behavior: Behavior normal.  Thought Content: Thought content normal.        Judgment: Judgment normal.           Assessment & Plan:  Well exam. We discussed diet and exercise. Get fasting labs to check lipids, etc . His GERD and BPH re stable. He has some depression with anxiety, and he will start on Zoloft 50 mg daily. Use Valium as needed. Recheck here in 3-4 weeks. We spent a total of ( 35  ) minutes reviewing records and discussing these issues.  Alysia Penna, MD

## 2022-01-03 MED ORDER — EZETIMIBE 10 MG PO TABS: 10.0000 mg | ORAL_TABLET | Freq: Every day | ORAL | 3 refills | Status: DC

## 2022-01-03 NOTE — Addendum Note (Signed)
Addended by: Agnes Lawrence on: 01/03/2022 11:02 AM   Modules accepted: Orders

## 2022-01-11 ENCOUNTER — Telehealth: Payer: Self-pay | Admitting: Family Medicine

## 2022-01-11 NOTE — Telephone Encounter (Signed)
Left message for patient to call back and schedule Medicare Annual Wellness Visit (AWV) either virtually or in office. Left  my Micheal Hamilton number [phone]   Last AWV 08/21/20  please schedule at anytime with [facility] Nurse Health Advisor 1 or 2

## 2022-01-24 ENCOUNTER — Telehealth: Payer: Self-pay

## 2022-01-24 NOTE — Telephone Encounter (Signed)
Unsuccessful attempt to reach patient on preferred number listed in notes for scheduled AWV. Left message on voicemail okay to reschedule. 

## 2022-01-26 ENCOUNTER — Other Ambulatory Visit: Payer: Self-pay | Admitting: Family Medicine

## 2022-01-27 ENCOUNTER — Other Ambulatory Visit: Payer: Self-pay | Admitting: Family Medicine

## 2022-01-27 DIAGNOSIS — E559 Vitamin D deficiency, unspecified: Secondary | ICD-10-CM

## 2022-01-31 ENCOUNTER — Telehealth: Payer: Self-pay | Admitting: Family Medicine

## 2022-01-31 ENCOUNTER — Other Ambulatory Visit: Payer: Self-pay

## 2022-01-31 MED ORDER — SERTRALINE HCL 100 MG PO TABS: 100.0000 mg | ORAL_TABLET | Freq: Every day | ORAL | 5 refills | Status: DC

## 2022-01-31 NOTE — Telephone Encounter (Signed)
Pt called to ask if MD could increase the dosage on the following:  sertraline (ZOLOFT) 50 MG tablet  From 50 mg to 100 mg.  As the current dosage of 50 mgs does not appear to do enough.  Pt stated that both he and his wife would like an increase for this medication to 100 mgs, (as per discussion during previous visit)   LOV:   01/02/22  Please advise.  [id number] [place]  [phone] Fax:  [phone]

## 2022-01-31 NOTE — Telephone Encounter (Signed)
Okay. Call in Sertraline 100 mg daily, #30 with 5 rf

## 2022-01-31 NOTE — Telephone Encounter (Signed)
Spoke with patient, aware Zoloft '100mg'$  [facility].

## 2022-01-31 NOTE — Telephone Encounter (Signed)
Please advise.   Pharmacy updated 

## 2022-02-07 ENCOUNTER — Ambulatory Visit (INDEPENDENT_AMBULATORY_CARE_PROVIDER_SITE_OTHER): Payer: Medicare Other

## 2022-02-07 VITALS — Ht 68.25 in | Wt 209.0 lb

## 2022-02-07 DIAGNOSIS — Z0000 Encounter for general adult medical examination without abnormal findings: Secondary | ICD-10-CM

## 2022-02-07 NOTE — Patient Instructions (Signed)
Micheal Hamilton , Thank you for taking time to come for your Medicare Wellness Visit. I appreciate your ongoing commitment to your health goals. Please review the following plan we discussed and let me know if I can assist you in the future.   Screening recommendations/referrals: Colonoscopy: Doe 09/17/19 Repeat 10 yrs Recommended yearly ophthalmology/optometry visit for glaucoma screening and checkup Recommended yearly dental visit for hygiene and checkup  Vaccinations: Influenza vaccine: Up to date Pneumococcal vaccine: Due Tdap vaccine: Up to date Shingles vaccine: Done   Covid-19: Done  Advanced directives: Patient deferred   Conditions/risks identified: None  Next appointment: Follow up in one year for your annual wellness visit.    Preventive Care 32 Years and Older, Male  Preventive care refers to lifestyle choices and visits with your health care provider that can promote health and wellness. What does preventive care include? A yearly physical exam. This is also called an annual well check. Dental exams once or twice a year. Routine eye exams. Ask your health care provider how often you should have your eyes checked. Personal lifestyle choices, including: Daily care of your teeth and gums. Regular physical activity. Eating a healthy diet. Avoiding tobacco and drug use. Limiting alcohol use. Practicing safe sex. Taking low doses of aspirin every day. Taking vitamin and mineral supplements as recommended by your health care provider. What happens during an annual well check? The services and screenings done by your health care provider during your annual well check will depend on your age, overall health, lifestyle risk factors, and family history of disease. Counseling  Your health care provider may ask you questions about your: Alcohol use. Tobacco use. Drug use. Emotional well-being. Home and relationship well-being. Sexual activity. Eating habits. History of  falls. Memory and ability to understand (cognition). Work and work Statistician. Screening  You may have the following tests or measurements: Height, weight, and BMI. Blood pressure. Lipid and cholesterol levels. These may be checked every 5 years, or more frequently if you are over 24 years old. Skin check. Lung cancer screening. You may have this screening every year starting at age 82 if you have a 30-pack-year history of smoking and currently smoke or have quit within the past 15 years. Fecal occult blood test (FOBT) of the stool. You may have this test every year starting at age 65. Flexible sigmoidoscopy or colonoscopy. You may have a sigmoidoscopy every 5 years or a colonoscopy every 10 years starting at age 57. Prostate cancer screening. Recommendations will vary depending on your family history and other risks. Hepatitis C blood test. Hepatitis B blood test. Sexually transmitted disease (STD) testing. Diabetes screening. This is done by checking your blood sugar (glucose) after you have not eaten for a while (fasting). You may have this done every 1-3 years. Abdominal aortic aneurysm (AAA) screening. You may need this if you are a current or former smoker. Osteoporosis. You may be screened starting at age 56 if you are at high risk. Talk with your health care provider about your test results, treatment options, and if necessary, the need for more tests. Vaccines  Your health care provider may recommend certain vaccines, such as: Influenza vaccine. This is recommended every year. Tetanus, diphtheria, and acellular pertussis (Tdap, Td) vaccine. You may need a Td booster every 10 years. Zoster vaccine. You may need this after age 12. Pneumococcal 13-valent conjugate (PCV13) vaccine. One dose is recommended after age 33. Pneumococcal polysaccharide (PPSV23) vaccine. One dose is recommended after age 36.  Talk to your health care provider about which screenings and vaccines you need and  how often you need them. This information is not intended to replace advice given to you by your health care provider. Make sure you discuss any questions you have with your health care provider. Document Released: 06/23/2015 Document Revised: 02/14/2016 Document Reviewed: 03/28/2015 Elsevier Interactive Patient Education  2017 [place] Prevention in the Home Falls can cause injuries. They can happen to people of all ages. There are many things you can do to make your home safe and to help prevent falls. What can I do on the outside of my home? Regularly fix the edges of walkways and driveways and fix any cracks. Remove anything that might make you trip as you walk through a door, such as a raised step or threshold. Trim any bushes or trees on the path to your home. Use bright outdoor lighting. Clear any walking paths of anything that might make someone trip, such as rocks or tools. Regularly check to see if handrails are loose or broken. Make sure that both sides of any steps have handrails. Any raised decks and porches should have guardrails on the edges. Have any leaves, snow, or ice cleared regularly. Use sand or salt on walking paths during winter. Clean up any spills in your garage right away. This includes oil or grease spills. What can I do in the bathroom? Use night lights. Install grab bars by the toilet and in the tub and shower. Do not use towel bars as grab bars. Use non-skid mats or decals in the tub or shower. If you need to sit down in the shower, use a plastic, non-slip stool. Keep the floor dry. Clean up any water that spills on the floor as soon as it happens. Remove soap buildup in the tub or shower regularly. Attach bath mats securely with double-sided non-slip rug tape. Do not have throw rugs and other things on the floor that can make you trip. What can I do in the bedroom? Use night lights. Make sure that you have a light by your bed that is easy to  reach. Do not use any sheets or blankets that are too big for your bed. They should not hang down onto the floor. Have a firm chair that has side arms. You can use this for support while you get dressed. Do not have throw rugs and other things on the floor that can make you trip. What can I do in the kitchen? Clean up any spills right away. Avoid walking on wet floors. Keep items that you use a lot in easy-to-reach places. If you need to reach something above you, use a strong step stool that has a grab bar. Keep electrical cords out of the way. Do not use floor polish or wax that makes floors slippery. If you must use wax, use non-skid floor wax. Do not have throw rugs and other things on the floor that can make you trip. What can I do with my stairs? Do not leave any items on the stairs. Make sure that there are handrails on both sides of the stairs and use them. Fix handrails that are broken or loose. Make sure that handrails are as long as the stairways. Check any carpeting to make sure that it is firmly attached to the stairs. Fix any carpet that is loose or worn. Avoid having throw rugs at the top or bottom of the stairs. If you do have throw  rugs, attach them to the floor with carpet tape. Make sure that you have a light switch at the top of the stairs and the bottom of the stairs. If you do not have them, ask someone to add them for you. What else can I do to help prevent falls? Wear shoes that: Do not have high heels. Have rubber bottoms. Are comfortable and fit you well. Are closed at the toe. Do not wear sandals. If you use a stepladder: Make sure that it is fully opened. Do not climb a closed stepladder. Make sure that both sides of the stepladder are locked into place. Ask someone to hold it for you, if possible. Clearly mark and make sure that you can see: Any grab bars or handrails. First and last steps. Where the edge of each step is. Use tools that help you move  around (mobility aids) if they are needed. These include: Canes. Walkers. Scooters. Crutches. Turn on the lights when you go into a dark area. Replace any light bulbs as soon as they burn out. Set up your furniture so you have a clear path. Avoid moving your furniture around. If any of your floors are uneven, fix them. If there are any pets around you, be aware of where they are. Review your medicines with your doctor. Some medicines can make you feel dizzy. This can increase your chance of falling. Ask your doctor what other things that you can do to help prevent falls. This information is not intended to replace advice given to you by your health care provider. Make sure you discuss any questions you have with your health care provider. Document Released: 03/23/2009 Document Revised: 11/02/2015 Document Reviewed: 07/01/2014 Elsevier Interactive Patient Education  2017 [employer].

## 2022-02-07 NOTE — Progress Notes (Signed)
Subjective:   Micheal Hamilton is a 71 y.o. male who presents for Medicare Annual/Subsequent preventive examination.  Review of Systems    Virtual Visit via Telephone Note  I connected with  Micheal Hamilton on 02/07/22 at  3:45 PM EDT by telephone and verified that I am speaking with the correct person using two identifiers.  Location: Patient: Home Provider: Office Persons participating in the virtual visit: patient/Nurse Health Advisor   I discussed the limitations, risks, security and privacy concerns of performing an evaluation and management service by telephone and the availability of in person appointments. The patient expressed understanding and agreed to proceed.  Interactive audio and video telecommunications were attempted between this nurse and patient, however failed, due to patient having technical difficulties OR patient did not have access to video capability.  We continued and completed visit with audio only.  Some vital signs may be absent or patient reported.   Criselda Peaches, LPN  Cardiac Risk Factors include: advanced age (>10mn, >>51women);male gender     Objective:    Today's Vitals   02/07/22 1544  Weight: 209 lb (94.8 kg)  Height: 5' 8.25" (1.734 m)   Body mass index is 31.55 kg/m.     02/07/2022    3:58 PM 08/27/2013    1:13 PM  Advanced Directives  Does Patient Have a Medical Advance Directive? Yes Patient has advance directive, copy not in chart  Type of Advance Directive [place] will Living will  Does patient want to make changes to medical advance directive?  No change requested  Copy of [place] Chart? No - copy requested     Current Medications (verified) Outpatient Encounter Medications as of 02/07/2022  Medication Sig   diazepam (VALIUM) 5 MG tablet Take 1 tablet (5 mg total) by mouth every 8 (eight) hours as needed for anxiety.   diclofenac (VOLTAREN) 75 MG EC tablet Take 1  tablet (75 mg total) by mouth 2 (two) times daily.   ezetimibe (ZETIA) 10 MG tablet Take 1 tablet (10 mg total) by mouth daily.   ferrous sulfate 325 (65 FE) MG tablet Take 1 tablet (325 mg total) by mouth 3 (three) times daily with meals.   meclizine (ANTIVERT) 25 MG tablet Take 1 tablet (25 mg total) by mouth every 4 (four) hours as needed for dizziness.   mometasone (ELOCON) 0.1 % cream Apply 1 application topically 2 (two) times daily as needed.   sertraline (ZOLOFT) 100 MG tablet Take 1 tablet (100 mg total) by mouth daily.   vitamin C (ASCORBIC ACID) 250 MG tablet Take 1 tablet (250 mg total) by mouth 3 (three) times daily.   Vitamin D, Ergocalciferol, (DRISDOL) 1.25 MG (50000 UNIT) CAPS capsule TAKE 1 CAPSULE BY MOUTH ONE TIME PER WEEK   zinc gluconate 50 MG tablet Take 50 mg by mouth daily.   No facility-administered encounter medications on file as of 02/07/2022.    Allergies (verified) Crestor [rosuvastatin]   History: Past Medical History:  Diagnosis Date   Allergy    occ seasonally    Arthritis    Colon polyps    hx of   Diverticulosis    GERD (gastroesophageal reflux disease)    Hyperlipidemia    Iron deficiency anemia    presumed from hemorrhoids which bleed daily (GI consult 2014)   Obesity    Pre-diabetes    no medicines    Past Surgical History:  Procedure Laterality Date  BAND HEMORRHOIDECTOMY     COLONOSCOPY  09/17/2019   per Dr. Ardis Hughs, no polyps, repeat in 10  yrs   edg  2004   Dr Amedeo Plenty- hiatal hernia   POLYPECTOMY     TONSILLECTOMY     UPPER GASTROINTESTINAL ENDOSCOPY     Family History  Problem Relation Age of Onset   Lung cancer Father    Cancer Father        lung   Crohn's disease Sister    Diabetes Cousin    Heart disease Maternal Uncle    Colon cancer Neg Hx    Colon polyps Neg Hx    Esophageal cancer Neg Hx    Stomach cancer Neg Hx    Rectal cancer Neg Hx    Social History   Socioeconomic History   Marital status: Married     Spouse name: Not on file   Number of children: 3   Years of education: Not on file   Highest education level: Not on file  Occupational History   Occupation: [employer]    Comment: Audiological scientist  Tobacco Use   Smoking status: Former    Packs/day: 2.50    Years: 25.00    Total pack years: 62.50    Types: Cigarettes    Start date: 03/10/1964    Quit date: 06/10/1988    Years since quitting: 33.6   Smokeless tobacco: Never   Tobacco comments:    quit 25 years ago  Substance and Sexual Activity   Alcohol use: Yes   Drug use: No   Sexual activity: Not on file  Other Topics Concern   Not on file  Social History Narrative   Regular exercise- No   Social Determinants of Health   Financial Resource Strain: Low Risk  (02/07/2022)   Overall Financial Resource Strain (CARDIA)    Difficulty of Paying Living Expenses: Not hard at all  Food Insecurity: No Food Insecurity (02/07/2022)   Hunger Vital Sign    Worried About Running Out of Food in the Last Year: Never true    [place] in the Last Year: Never true  Transportation Needs: No Transportation Needs (02/07/2022)   PRAPARE - Hydrologist (Medical): No    Lack of Transportation (Non-Medical): No  Physical Activity: Inactive (02/07/2022)   Exercise Vital Sign    Days of Exercise per Week: 0 days    Minutes of Exercise per Session: 0 min  Stress: No Stress Concern Present (02/07/2022)   [place]    Feeling of Stress : Not at all  Social Connections: Moderately Integrated (02/07/2022)   Social Connection and Isolation Panel [NHANES]    Frequency of Communication with Friends and Family: More than three times a week    Frequency of Social Gatherings with Friends and Family: More than three times a week    Attends Religious Services: Never    Marine scientist or Organizations: No    Attends Arts administrator: More than 4 times per year    Marital Status: Married    Tobacco Counseling Counseling given: Not Answered Tobacco comments: quit 25 years ago   Clinical Intake:  Pre-visit preparation completed: No  Pain : No/denies pain     BMI - recorded: 31.55 Nutritional Status: BMI > 30  Obese Nutritional Risks: None Diabetes: No  How often do you need to have someone help you when you  read instructions, pamphlets, or other written materials from your doctor or pharmacy?: 1 - Never  Diabetic?  No  Interpreter Needed?: No  Information entered by :: Rolene Arbour LPN   Activities of Daily Living    02/07/2022    3:56 PM  In your present state of health, do you have any difficulty performing the following activities:  Hearing? 0  Vision? 0  Difficulty concentrating or making decisions? 0  Walking or climbing stairs? 0  Dressing or bathing? 0  Doing errands, shopping? 0  Preparing Food and eating ? N  Using the Toilet? N  In the past six months, have you accidently leaked urine? N  Do you have problems with loss of bowel control? N  Managing your Medications? N  Managing your Finances? N  Housekeeping or managing your Housekeeping? N    Patient Care Team: Laurey Morale, MD as PCP - General (Family Medicine)  Indicate any recent Medical Services you may have received from other than [facility] providers in the past year (date may be approximate).     Assessment:   This is a routine wellness examination for Domenico.  Hearing/Vision screen Hearing Screening - Comments:: Denies hearing difficulties   Vision Screening - Comments:: Wears rx glasses - up to date with routine eye exams with  [place] issues and exercise activities discussed: Current Exercise Habits: The patient does not participate in regular exercise at present, Exercise limited by: None identified   Goals Addressed               This Visit's Progress     Patient Stated         I want to age gracefully.       Stay Healthy (pt-stated)         Depression Screen    02/07/2022    3:54 PM 01/02/2022    2:25 PM 10/03/2021    2:36 PM 08/21/2020    2:57 PM 05/10/2020    8:10 AM 04/28/2018    8:37 AM 02/19/2018    8:39 AM  PHQ 2/9 Scores  PHQ - 2 Score 0 4 2 0 0 0 2  PHQ- 9 Score 0 10 6        Fall Risk    02/07/2022    3:57 PM 01/02/2022    2:24 PM 10/03/2021    2:36 PM 08/21/2020    2:56 PM 05/10/2020    8:09 AM  Fall Risk   Falls in the past year? 0 0 0 0 0  Number falls in past yr: 0 0 0 0 0  Injury with Fall? 0 0 0 0 0  Risk for fall due to : No Fall Risks No Fall Risks No Fall Risks No Fall Risks   Follow up Falls prevention discussed Falls evaluation completed Falls evaluation completed Falls evaluation completed;Falls prevention discussed     FALL RISK PREVENTION PERTAINING TO THE HOME:  Any stairs in or around the home? Yes  If so, are there any without handrails? No  Home free of loose throw rugs in walkways, pet beds, electrical cords, etc? Yes  Adequate lighting in your home to reduce risk of falls? Yes   ASSISTIVE DEVICES UTILIZED TO PREVENT FALLS:  Life alert? No  Use of a cane, walker or w/c? No  Grab bars in the bathroom? Yes  Shower chair or bench in shower? No  Elevated toilet seat or a handicapped toilet? No   TIMED  UP AND GO:  Was the test performed? No . Audio Visit  Cognitive Function:        02/07/2022    4:02 PM  6CIT Screen  What Year? 0 points  What month? 0 points  What time? 0 points  Count back from 20 0 points  Months in reverse 0 points  Repeat phrase 0 points  Total Score 0 points    Immunizations Immunization History  Administered Date(s) Administered   Fluad Quad(high Dose 65+) 04/01/2020   Influenza, High Dose Seasonal PF 05/01/2019, 05/04/2019   Influenza,inj,Quad PF,6+ Mos 02/27/2015, 03/11/2016   PFIZER(Purple Top)SARS-COV-2 Vaccination 07/15/2019, 08/09/2019   Tdap 02/27/2015, 03/15/2018    Zoster Recombinat (Shingrix) 08/10/2017, 12/21/2017    TDAP status: Up to date  Flu Vaccine status: Up to date  Pneumococcal vaccine status: Due, Education has been provided regarding the importance of this vaccine. Advised may receive this vaccine at local pharmacy or Health Dept. Aware to provide a copy of the vaccination record if obtained from local pharmacy or Health Dept. Verbalized acceptance and understanding.  Covid-19 vaccine status: Completed vaccines  Qualifies for Shingles Vaccine? Yes   Zostavax completed Yes   Shingrix Completed?: Yes  Screening Tests Health Maintenance  Topic Date Due   INFLUENZA VACCINE  01/08/2022   Pneumonia Vaccine 26+ Years old (1 - PCV) 01/03/2023 (Originally 06/21/2016)   COVID-19 Vaccine (4 - Pfizer series) 01/08/2023 (Originally 08/25/2021)   Hepatitis C Screening  02/08/2023 (Originally 06/21/1968)   TETANUS/TDAP  03/15/2028   COLONOSCOPY (Pts 45-36yr Insurance coverage will need to be confirmed)  09/16/2029   Zoster Vaccines- Shingrix  Completed   HPV VACCINES  Aged Out    Health Maintenance  Health Maintenance Due  Topic Date Due   INFLUENZA VACCINE  01/08/2022    Colorectal cancer screening: Type of screening: Colonoscopy. Completed 09/17/19. Repeat every 10 years  Lung Cancer Screening: (Low Dose CT Chest recommended if Age 441-80years, 30 pack-year currently smoking OR have quit w/in 15years.) does not qualify.     Additional Screening:  Hepatitis C Screening: does qualify; Completed Deferred  Vision Screening: Recommended annual ophthalmology exams for early detection of glaucoma and other disorders of the eye. Is the patient up to date with their annual eye exam?  Yes  Who is the provider or what is the name of the office in which the patient attends annual eye exams? [place] is not established with a provider, would they like to be referred to a provider to establish care? No .   Dental Screening:  Recommended annual dental exams for proper oral hygiene  Community Resource Referral / Chronic Care Management:  CRR required this visit?  No   CCM required this visit?  No      Plan:     I have personally reviewed and noted the following in the patient's chart:   Medical and social history Use of alcohol, tobacco or illicit drugs  Current medications and supplements including opioid prescriptions. Patient is not currently taking opioid prescriptions. Functional ability and status Nutritional status Physical activity Advanced directives List of other physicians Hospitalizations, surgeries, and ER visits in previous 12 months Vitals Screenings to include cognitive, depression, and falls Referrals and appointments  In addition, I have reviewed and discussed with patient certain preventive protocols, quality metrics, and best practice recommendations. A written personalized care plan for preventive services as well as general preventive health recommendations were provided to patient.  Criselda Peaches, LPN   9/43/2761   Nurse Notes: None

## 2022-03-01 ENCOUNTER — Telehealth: Payer: Self-pay | Admitting: Family Medicine

## 2022-03-01 ENCOUNTER — Other Ambulatory Visit: Payer: Self-pay | Admitting: Family Medicine

## 2022-03-01 NOTE — Telephone Encounter (Signed)
Both prescriptions were sent to pharmacy in 2021 and 2019.   Okay for refills?

## 2022-03-01 NOTE — Telephone Encounter (Signed)
Rquesting refill of meclizine (ANTIVERT) 25 MG tablet, mometasone (ELOCON) 0.1 % cream. Was using prn and needs refill now   [id number]  [phone] Fax:  [phone]

## 2022-03-04 ENCOUNTER — Other Ambulatory Visit: Payer: Self-pay

## 2022-03-04 DIAGNOSIS — H8110 Benign paroxysmal vertigo, unspecified ear: Secondary | ICD-10-CM

## 2022-03-04 MED ORDER — MOMETASONE FUROATE 0.1 % EX CREA: 1.0000 | TOPICAL_CREAM | Freq: Two times a day (BID) | CUTANEOUS | 6 refills | Status: AC | PRN

## 2022-03-04 MED ORDER — MECLIZINE HCL 25 MG PO TABS: 25.0000 mg | ORAL_TABLET | ORAL | 10 refills | Status: AC | PRN

## 2022-03-04 NOTE — Telephone Encounter (Signed)
Both medications sent to pharmacy for 1 year supply.    Called patient to inform medication has been [facility].

## 2022-03-04 NOTE — Telephone Encounter (Signed)
Please refill both for a year

## 2022-04-08 ENCOUNTER — Other Ambulatory Visit: Payer: Medicare Other

## 2022-04-08 DIAGNOSIS — E782 Mixed hyperlipidemia: Secondary | ICD-10-CM

## 2022-04-08 LAB — LIPID PANEL
Cholesterol: 224 mg/dL — ABNORMAL HIGH (ref 0–200)
HDL: 40.6 mg/dL (ref >39.00)
NonHDL: 183.58
Total CHOL/HDL Ratio: 6
Triglycerides: 266 mg/dL — ABNORMAL HIGH (ref 0.0–149.0)
VLDL: 53.2 mg/dL — ABNORMAL HIGH (ref 0.0–40.0)

## 2022-04-08 LAB — HEPATIC FUNCTION PANEL
ALT: 20 U/L (ref 0–53)
AST: 20 U/L (ref 0–37)
Albumin: 4.4 g/dL (ref 3.5–5.2)
Alkaline Phosphatase: 53 U/L (ref 39–117)
Bilirubin, Direct: 0.2 mg/dL (ref 0.0–0.3)
Total Bilirubin: 0.8 mg/dL (ref 0.2–1.2)
Total Protein: 7.2 g/dL (ref 6.0–8.3)

## 2022-04-08 LAB — LDL CHOLESTEROL, DIRECT: Direct LDL: 163 mg/dL

## 2022-04-10 ENCOUNTER — Telehealth: Payer: Self-pay | Admitting: Family Medicine

## 2022-04-10 ENCOUNTER — Other Ambulatory Visit: Payer: Self-pay | Admitting: Family Medicine

## 2022-04-10 NOTE — Telephone Encounter (Signed)
Pt called, returning CMA's call. CMA (S.T.) stated she would call Pt back at her earliest convenience.

## 2022-04-10 NOTE — Telephone Encounter (Signed)
Spoke with patient about results.

## 2022-04-20 ENCOUNTER — Other Ambulatory Visit: Payer: Self-pay | Admitting: Family Medicine

## 2022-04-20 DIAGNOSIS — E559 Vitamin D deficiency, unspecified: Secondary | ICD-10-CM

## 2022-04-29 ENCOUNTER — Telehealth: Payer: Self-pay | Admitting: Family Medicine

## 2022-04-29 ENCOUNTER — Other Ambulatory Visit: Payer: Self-pay

## 2022-04-29 DIAGNOSIS — E559 Vitamin D deficiency, unspecified: Secondary | ICD-10-CM

## 2022-04-29 MED ORDER — VITAMIN D (ERGOCALCIFEROL) 1.25 MG (50000 UNIT) PO CAPS: ORAL_CAPSULE | ORAL | 0 refills | Status: DC

## 2022-04-29 NOTE — Telephone Encounter (Signed)
Pt is calling and need a refill on Vitamin D, Ergocalciferol, (DRISDOL) 1.25 MG (50000 UNIT) CAPS capsule  [id number] [address]: [phone] Fax: [phone]

## 2022-04-29 NOTE — Telephone Encounter (Signed)
[facility] [address]

## 2022-05-08 ENCOUNTER — Other Ambulatory Visit: Payer: Medicare Other

## 2022-07-18 ENCOUNTER — Other Ambulatory Visit: Payer: Self-pay | Admitting: Family Medicine

## 2022-07-18 DIAGNOSIS — E559 Vitamin D deficiency, unspecified: Secondary | ICD-10-CM

## 2022-09-26 ENCOUNTER — Other Ambulatory Visit: Payer: Self-pay | Admitting: Family Medicine

## 2022-09-26 DIAGNOSIS — E559 Vitamin D deficiency, unspecified: Secondary | ICD-10-CM

## 2022-12-26 ENCOUNTER — Other Ambulatory Visit: Payer: Self-pay | Admitting: Family Medicine

## 2023-01-04 ENCOUNTER — Other Ambulatory Visit: Payer: Self-pay | Admitting: Family Medicine

## 2023-01-06 ENCOUNTER — Encounter: Payer: Self-pay | Admitting: Family Medicine

## 2023-01-06 ENCOUNTER — Ambulatory Visit (INDEPENDENT_AMBULATORY_CARE_PROVIDER_SITE_OTHER): Payer: Medicare HMO | Admitting: Family Medicine

## 2023-01-06 VITALS — BP 108/78 | HR 70 | Temp 97.7°F | Ht 67.5 in | Wt 224.0 lb

## 2023-01-06 DIAGNOSIS — F418 Other specified anxiety disorders: Secondary | ICD-10-CM | POA: Diagnosis not present

## 2023-01-06 DIAGNOSIS — K219 Gastro-esophageal reflux disease without esophagitis: Secondary | ICD-10-CM | POA: Diagnosis not present

## 2023-01-06 DIAGNOSIS — E559 Vitamin D deficiency, unspecified: Secondary | ICD-10-CM

## 2023-01-06 DIAGNOSIS — H8110 Benign paroxysmal vertigo, unspecified ear: Secondary | ICD-10-CM | POA: Diagnosis not present

## 2023-01-06 DIAGNOSIS — N138 Other obstructive and reflux uropathy: Secondary | ICD-10-CM

## 2023-01-06 DIAGNOSIS — Z23 Encounter for immunization: Secondary | ICD-10-CM

## 2023-01-06 DIAGNOSIS — N401 Enlarged prostate with lower urinary tract symptoms: Secondary | ICD-10-CM

## 2023-01-06 DIAGNOSIS — R7303 Prediabetes: Secondary | ICD-10-CM | POA: Diagnosis not present

## 2023-01-06 DIAGNOSIS — E782 Mixed hyperlipidemia: Secondary | ICD-10-CM

## 2023-01-06 DIAGNOSIS — D509 Iron deficiency anemia, unspecified: Secondary | ICD-10-CM

## 2023-01-06 DIAGNOSIS — E7800 Pure hypercholesterolemia, unspecified: Secondary | ICD-10-CM | POA: Diagnosis not present

## 2023-01-06 LAB — IBC + FERRITIN
Ferritin: 78.5 ng/mL (ref 22.0–322.0)
Iron: 76 ug/dL (ref 42–165)
Saturation Ratios: 22.6 % (ref 20.0–50.0)
TIBC: 336 ug/dL (ref 250.0–450.0)
Transferrin: 240 mg/dL (ref 212.0–360.0)

## 2023-01-06 LAB — HEPATIC FUNCTION PANEL
ALT: 19 U/L (ref 0–53)
AST: 20 U/L (ref 0–37)
Albumin: 4.6 g/dL (ref 3.5–5.2)
Alkaline Phosphatase: 58 U/L (ref 39–117)
Bilirubin, Direct: 0.1 mg/dL (ref 0.0–0.3)
Total Bilirubin: 0.6 mg/dL (ref 0.2–1.2)
Total Protein: 7.3 g/dL (ref 6.0–8.3)

## 2023-01-06 LAB — CBC WITH DIFFERENTIAL/PLATELET
Basophils Absolute: 0.1 10*3/uL (ref 0.0–0.1)
Basophils Relative: 0.7 % (ref 0.0–3.0)
Eosinophils Absolute: 0.2 10*3/uL (ref 0.0–0.7)
Eosinophils Relative: 3.1 % (ref 0.0–5.0)
HCT: 52.5 % — ABNORMAL HIGH (ref 39.0–52.0)
Hemoglobin: 16.7 g/dL (ref 13.0–17.0)
Lymphocytes Relative: 18.9 % (ref 12.0–46.0)
Lymphs Abs: 1.4 10*3/uL (ref 0.7–4.0)
MCHC: 31.9 g/dL (ref 30.0–36.0)
MCV: 83.3 fl (ref 78.0–100.0)
Monocytes Absolute: 1 10*3/uL (ref 0.1–1.0)
Monocytes Relative: 13 % — ABNORMAL HIGH (ref 3.0–12.0)
Neutro Abs: 4.8 10*3/uL (ref 1.4–7.7)
Neutrophils Relative %: 64.3 % (ref 43.0–77.0)
Platelets: 287 10*3/uL (ref 150.0–400.0)
RBC: 6.3 Mil/uL — ABNORMAL HIGH (ref 4.22–5.81)
RDW: 15.4 % (ref 11.5–15.5)
WBC: 7.5 10*3/uL (ref 4.0–10.5)

## 2023-01-06 LAB — BASIC METABOLIC PANEL
BUN: 14 mg/dL (ref 6–23)
CO2: 25 mEq/L (ref 19–32)
Calcium: 9.7 mg/dL (ref 8.4–10.5)
Chloride: 103 mEq/L (ref 96–112)
Creatinine, Ser: 0.84 mg/dL (ref 0.40–1.50)
GFR: 87.72 mL/min (ref >60.00)
Glucose, Bld: 111 mg/dL — ABNORMAL HIGH (ref 70–99)
Potassium: 4.6 mEq/L (ref 3.5–5.1)
Sodium: 139 mEq/L (ref 135–145)

## 2023-01-06 LAB — HEMOGLOBIN A1C: Hgb A1c MFr Bld: 6.3 % (ref 4.6–6.5)

## 2023-01-06 LAB — TSH: TSH: 1.8 u[IU]/mL (ref 0.35–5.50)

## 2023-01-06 LAB — LDL CHOLESTEROL, DIRECT: Direct LDL: 174 mg/dL

## 2023-01-06 LAB — LIPID PANEL
Cholesterol: 268 mg/dL — ABNORMAL HIGH (ref 0–200)
HDL: 41.5 mg/dL
NonHDL: 226.25
Total CHOL/HDL Ratio: 6
Triglycerides: 396 mg/dL — ABNORMAL HIGH (ref 0.0–149.0)
VLDL: 79.2 mg/dL — ABNORMAL HIGH (ref 0.0–40.0)

## 2023-01-06 LAB — VITAMIN D 25 HYDROXY (VIT D DEFICIENCY, FRACTURES): VITD: 28.18 ng/mL — ABNORMAL LOW (ref 30.00–100.00)

## 2023-01-06 LAB — PSA: PSA: 2.65 ng/mL (ref 0.10–4.00)

## 2023-01-06 MED ORDER — FERROUS SULFATE 325 (65 FE) MG PO TABS: 325.0000 mg | ORAL_TABLET | Freq: Three times a day (TID) | ORAL | 3 refills | Status: DC

## 2023-01-06 NOTE — Progress Notes (Signed)
Subjective:    Patient ID: Micheal Hamilton, male    DOB: [birth year], 72 y.o.   MRN: [id number]  HPI Here to follow up on issues. He feels well. His depression and anxiety are stable. He takes 2-3 iron tablets daily. His GERD and vertigo are stable.     Review of Systems  Constitutional: Negative.   HENT: Negative.    Eyes: Negative.   Respiratory: Negative.    Cardiovascular: Negative.   Gastrointestinal: Negative.   Genitourinary: Negative.   Musculoskeletal:  Positive for arthralgias.  Skin: Negative.   Neurological: Negative.   Psychiatric/Behavioral: Negative.         Objective:   Physical Exam Constitutional:      General: He is not in acute distress.    Appearance: Normal appearance. He is well-developed. He is not diaphoretic.  HENT:     Head: Normocephalic and atraumatic.     Right Ear: External ear normal.     Left Ear: External ear normal.     Nose: Nose normal.     Mouth/Throat:     Pharynx: No oropharyngeal exudate.  Eyes:     General: No scleral icterus.       Right eye: No discharge.        Left eye: No discharge.     Conjunctiva/sclera: Conjunctivae normal.     Pupils: Pupils are equal, round, and reactive to light.  Neck:     Thyroid: No thyromegaly.     Vascular: No JVD.     Trachea: No tracheal deviation.  Cardiovascular:     Rate and Rhythm: Normal rate and regular rhythm.     Pulses: Normal pulses.     Heart sounds: Normal heart sounds. No murmur heard.    No friction rub. No gallop.  Pulmonary:     Effort: Pulmonary effort is normal. No respiratory distress.     Breath sounds: Normal breath sounds. No wheezing or rales.  Chest:     Chest wall: No tenderness.  Abdominal:     General: Bowel sounds are normal. There is no distension.     Palpations: Abdomen is soft. There is no mass.     Tenderness: There is no abdominal tenderness. There is no guarding or rebound.  Genitourinary:    Penis: Normal. No tenderness.      Testes: Normal.      Prostate: Normal.     Rectum: Normal. Guaiac result negative.  Musculoskeletal:        General: No tenderness. Normal range of motion.     Cervical back: Neck supple.  Lymphadenopathy:     Cervical: No cervical adenopathy.  Skin:    General: Skin is warm and dry.     Coloration: Skin is not pale.     Findings: No erythema or rash.  Neurological:     General: No focal deficit present.     Mental Status: He is alert and oriented to person, place, and time.     Cranial Nerves: No cranial nerve deficit.     Motor: No abnormal muscle tone.     Coordination: Coordination normal.     Deep Tendon Reflexes: Reflexes are normal and symmetric. Reflexes normal.  Psychiatric:        Mood and Affect: Mood normal.        Behavior: Behavior normal.        Thought Content: Thought content normal.        Judgment: Judgment normal.  Assessment & Plan:  His depression and anxiety are stable. His GERD and vertigo are well controlled. We will get fasting labs to check lipids, iron, ferritin, etc. Given a prevnar 20 vaccine.  Gershon Crane, MD

## 2023-01-08 NOTE — Addendum Note (Signed)
Addended byVickii Chafe on: 01/08/2023 08:10 AM   Modules accepted: Orders

## 2023-01-09 NOTE — Addendum Note (Signed)
Addended by: Christy Sartorius on: 01/09/2023 12:03 PM   Modules accepted: Orders

## 2023-01-23 ENCOUNTER — Encounter (INDEPENDENT_AMBULATORY_CARE_PROVIDER_SITE_OTHER): Payer: Self-pay

## 2023-02-13 ENCOUNTER — Ambulatory Visit (INDEPENDENT_AMBULATORY_CARE_PROVIDER_SITE_OTHER): Payer: Medicare HMO | Admitting: Family Medicine

## 2023-02-13 DIAGNOSIS — Z0000 Encounter for general adult medical examination without abnormal findings: Secondary | ICD-10-CM | POA: Diagnosis not present

## 2023-02-13 NOTE — Progress Notes (Signed)
PATIENT CHECK-IN and HEALTH RISK ASSESSMENT QUESTIONNAIRE:  -completed by phone/video for upcoming Medicare Preventive Visit  Pre-Visit Check-in: 1)Vitals (height, wt, BP, etc) - record in vitals section for visit on day of visit Request home vitals (wt, BP, etc.) and enter into vitals, THEN update Vital Signs SmartPhrase below at the top of the HPI. See below.  2)Review and Update Medications, Allergies PMH, Surgeries, Social history in Epic 3)Hospitalizations in the last year with date/reason? no  4)Review and Update Care Team (patient's specialists) in Epic 5) Complete PHQ9 in Epic  6) Complete Fall Screening in Epic 7)Review all Health Maintenance Due and order under PCP if not done.  Medicare Wellness Patient Questionnaire:  Answer theses question about your habits: Do you drink alcohol?  On occasion If yes, how many drinks do you have a day? 1-2 drinks Have you ever smoked? Remotely, over 30 years ago How many packs a day do/did you smoke? Reports was a heavy smoker when smoked, > 1 ppd, but quit over 30 years ago Do you use smokeless tobacco?no Do you use an illicit drugs? No  Do you exercises? Active at work Typical breakfast: not much, muffin or toast Typical lunch: fried foods Typical dinner: varies  Answer theses question about you: Can you perform most household chores? yes Do you find it hard to follow a conversation in a noisy room? Yes, has had hearing aids in the past Do you often ask people to speak up or repeat themselves? some Do you feel that you have a problem with memory? none Do you balance your checkbook and or bank acounts? yes Do you feel safe at home?  Last dentist visit? Every couple years - plans schedule Do you need assistance with any of the following: Please note if so  - none  Driving?  Feeding yourself?  Getting from bed to chair?  Getting to the toilet?  Bathing or showering?  Dressing yourself?  Managing money?  Climbing a flight of  stairs  Preparing meals?    Do you have Advanced Directives in place (Living Will, Healthcare Power or Attorney)?  yes   Last eye Exam and location? 2 years ago, plans to schedule another visit   Do you currently use prescribed or non-prescribed narcotic or opioid pain medications? none  Do you have a history or close family history of breast, ovarian, tubal or peritoneal cancer or a family member with BRCA (breast cancer susceptibility 1 and 2) gene mutations?  Request home vitals (wt, BP, etc.) and enter into vitals, THEN update Vital Signs SmartPhrase below at the top of the HPI. See below.   Nurse/Assistant Credentials/time stamp:   ----------------------------------------------------------------------------------------------------------------------------------------------------------------------------------------------------------------------  Vital Signs: Unable to obtain new vitals due to this being a telehealth visit.   MEDICARE ANNUAL PREVENTIVE CARE VISIT WITH PROVIDER (Welcome to Medicare, initial annual wellness or annual wellness exam)  Virtual Visit via Phone Note  I connected with Micheal Hamilton on 02/13/23  by phone and verified that I am speaking with the correct person using two identifiers.  Location patient: home Location provider:work or home office Persons participating in the virtual visit: patient, provider  Concerns and/or follow up today: doing well, waiting to see lipid clinic for cholesterol. Does not tolerate statins.    See HM section in Epic for other details of completed HM.    ROS: negative for report of fevers, unintentional weight loss, vision changes, vision loss, hearing loss or change, chest pain, sob, hemoptysis, melena, hematochezia, hematuria, falls,  bleeding or bruising, thoughts of suicide or self harm, memory loss  Patient-completed extensive health risk assessment - reviewed and discussed with the patient: See Health Risk  Assessment completed with patient prior to the visit either above or in recent phone note. This was reviewed in detailed with the patient today and appropriate recommendations, orders and referrals were placed as needed per Summary below and patient instructions.   Review of Medical History: -PMH, PSH, Family History and current specialty and care providers reviewed and updated and listed below   Patient Care Team: Nelwyn Salisbury, MD as PCP - General (Family Medicine)   Past Medical History:  Diagnosis Date   Allergy    occ seasonally    Arthritis    Colon polyps    hx of   Diverticulosis    GERD (gastroesophageal reflux disease)    Hyperlipidemia    Iron deficiency anemia    presumed from hemorrhoids which bleed daily (GI consult 2014)   Obesity    Pre-diabetes    no medicines     Past Surgical History:  Procedure Laterality Date   BAND HEMORRHOIDECTOMY     COLONOSCOPY  09/17/2019   per Dr. Christella Hartigan, no polyps, repeat in 10  yrs   edg  2004   Dr Madilyn Fireman- hiatal hernia   POLYPECTOMY     TONSILLECTOMY     UPPER GASTROINTESTINAL ENDOSCOPY      Social History   Socioeconomic History   Marital status: Married    Spouse name: Not on file   Number of children: 3   Years of education: Not on file   Highest education level: Not on file  Occupational History   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, museum/gallery  Tobacco Use   Smoking status: Former    Current packs/day: 0.00    Average packs/day: 2.5 packs/day for 25.0 years (62.4 ttl pk-yrs)    Types: Cigarettes    Start date: 03/10/1964    Quit date: 06/10/1988    Years since quitting: 34.7   Smokeless tobacco: Never   Tobacco comments:    quit 25 years ago  Substance and Sexual Activity   Alcohol use: Yes   Drug use: No   Sexual activity: Not on file  Other Topics Concern   Not on file  Social History Narrative   Regular exercise- No   Social Determinants of Health   Financial Resource Strain: Low  Risk  (02/07/2022)   Overall Financial Resource Strain (CARDIA)    Difficulty of Paying Living Expenses: Not hard at all  Food Insecurity: No Food Insecurity (02/07/2022)   Hunger Vital Sign    Worried About Running Out of Food in the Last Year: Never true    Ran Out of Food in the Last Year: Never true  Transportation Needs: No Transportation Needs (02/07/2022)   PRAPARE - Administrator, Civil Service (Medical): No    Lack of Transportation (Non-Medical): No  Physical Activity: Inactive (01/06/2023)   Exercise Vital Sign    Days of Exercise per Week: 0 days    Minutes of Exercise per Session: 0 min  Stress: No Stress Concern Present (02/07/2022)   [employer] of Occupational Health - Occupational Stress Questionnaire    Feeling of Stress : Not at all  Social Connections: Moderately Integrated (02/07/2022)   Social Connection and Isolation Panel [NHANES]    Frequency of Communication with Friends and Family: More than three times a week  Frequency of Social Gatherings with Friends and Family: More than three times a week    Attends Religious Services: Never    Database administrator or Organizations: No    Attends Engineer, structural: More than 4 times per year    Marital Status: Married  Catering manager Violence: Not At Risk (02/07/2022)   Humiliation, Afraid, Rape, and Kick questionnaire    Fear of Current or Ex-Partner: No    Emotionally Abused: No    Physically Abused: No    Sexually Abused: No    Family History  Problem Relation Age of Onset   Lung cancer Father    Cancer Father        lung   Crohn's disease Sister    Diabetes Cousin    Heart disease Maternal Uncle    Colon cancer Neg Hx    Colon polyps Neg Hx    Esophageal cancer Neg Hx    Stomach cancer Neg Hx    Rectal cancer Neg Hx     Current Outpatient Medications on File Prior to Visit  Medication Sig Dispense Refill   diclofenac (VOLTAREN) 75 MG EC tablet TAKE 1 TABLET BY MOUTH  TWICE A DAY 180 tablet 3   ezetimibe (ZETIA) 10 MG tablet TAKE 1 TABLET BY MOUTH EVERY DAY 90 tablet 3   ferrous sulfate 325 (65 FE) MG tablet Take 1 tablet (325 mg total) by mouth 3 (three) times daily with meals. 270 tablet 3   meclizine (ANTIVERT) 25 MG tablet Take 1 tablet (25 mg total) by mouth every 4 (four) hours as needed for dizziness. (Patient not taking: Reported on 01/06/2023) 60 tablet 10   mometasone (ELOCON) 0.1 % cream Apply 1 Application topically 2 (two) times daily as needed. 45 g 6   sertraline (ZOLOFT) 100 MG tablet TAKE 1 TABLET BY MOUTH EVERY DAY 90 tablet 2   vitamin C (ASCORBIC ACID) 250 MG tablet Take 1 tablet (250 mg total) by mouth 3 (three) times daily. 270 tablet 3   Vitamin D, Ergocalciferol, (DRISDOL) 1.25 MG (50000 UNIT) CAPS capsule TAKE 1 CAPSULE BY MOUTH ONE TIME PER WEEK 12 capsule 3   zinc gluconate 50 MG tablet Take 50 mg by mouth daily.     No current facility-administered medications on file prior to visit.    Allergies  Allergen Reactions   Crestor [Rosuvastatin]     REACTION: Myositis       Physical Exam Vitals requested from patient and listed below if patient had equipment and was able to obtain at home for this virtual visit: There were no vitals filed for this visit. Estimated body mass index is 34.57 kg/m as calculated from the following:   Height as of 01/06/23: 5' 7.5" (1.715 m).   Weight as of 01/06/23: 224 lb (101.6 kg).  EKG (optional): deferred due to virtual visit  GENERAL: alert, oriented, no acute distress detected; full vision exam deferred due to pandemic and/or virtual encounter  MS: moves all visible extremities without noticeable abnormality  PSYCH/NEURO: pleasant and cooperative, no obvious depression or anxiety, speech and thought processing grossly intact, Cognitive function grossly intact  Flowsheet Row Clinical Support from 02/07/2022 in [facility]  PHQ-9 Total Score 0            02/13/2023   10:31 AM 02/07/2022    3:54 PM 01/02/2022    2:25 PM 10/03/2021    2:36 PM 08/21/2020    2:57 PM  Depression screen PHQ 2/9  Decreased Interest 0 0 2 1 0  Down, Depressed, Hopeless 0 0 2 1 0  PHQ - 2 Score 0 0 4 2 0  Altered sleeping  0 2 1   Tired, decreased energy  0 2 1   Change in appetite  0 0 1   Feeling bad or failure about yourself   0 0 0   Trouble concentrating  0 2 1   Moving slowly or fidgety/restless  0 0 0   Suicidal thoughts  0 0 0   PHQ-9 Score  0 10 6   Difficult doing work/chores  Not difficult at all Somewhat difficult Very difficult        08/21/2020    2:56 PM 10/03/2021    2:36 PM 01/02/2022    2:24 PM 02/07/2022    3:57 PM 02/13/2023   10:30 AM  Fall Risk  Falls in the past year? 0 0 0 0 0  Was there an injury with Fall? 0 0 0 0 0  Fall Risk Category Calculator 0 0 0 0 0  Fall Risk Category (Retired) Low Low Low Low   (RETIRED) Patient Fall Risk Level Low fall risk Low fall risk Low fall risk Low fall risk   Patient at Risk for Falls Due to No Fall Risks No Fall Risks No Fall Risks No Fall Risks No Fall Risks  Fall risk Follow up Falls evaluation completed;Falls prevention discussed Falls evaluation completed Falls evaluation completed Falls prevention discussed      SUMMARY AND PLAN:  Encounter for Medicare annual wellness exam   Disussed applicable health maintenance/preventive health measures and advised and referred or ordered per patient preferences: -discussed can get vaccines at the pharmacy -discussed hep c screening, can get with labs if wishes to do  Health Maintenance  Topic Date Due   Hepatitis C Screening  Never done   INFLUENZA VACCINE  01/09/2023   COVID-19 Vaccine (4 - 2023-24 season) 02/09/2023   Medicare Annual Wellness (AWV)  02/13/2024   DTaP/Tdap/Td (3 - Td or Tdap) 03/15/2028   Colonoscopy  09/16/2029   Pneumonia Vaccine 72+ Years old  Completed   Zoster Vaccines- Shingrix  Completed   HPV VACCINES  Aged [employer] and counseling on the following was provided based on the above review of health and a plan/checklist for the patient, along with additional information discussed, was provided for the patient in the patient instructions :   -Provided counseling and plan for difficulty hearing, he is considering getting another audiology exam and discussed options, considering cosco -Advised and counseled on a healthy lifestyle - including the importance of a healthy diet, regular physical activity, social connections and stress management. -Reviewed patient's current diet. Advised and counseled on a whole foods based healthy diet. Discussed foods to eat/avoid for improved metabolic health and cholesterol reduction. A summary of a healthy diet was provided in the Patient Instructions.  -reviewed patient's current physical activity level and discussed exercise guidelines for adults. Discussed community resources and ideas for safe exercise at home to assist in meeting exercise guideline recommendations in a safe and healthy way.  -Advise yearly dental visits at minimum and regular eye exams -Advised and counseled on alcohol safe limits  Follow up: see patient instructions   Patient Instructions  I really enjoyed getting to talk with you today! I am available on Tuesdays and Thursdays for virtual visits if you have any questions or concerns, or  if I can be of any further assistance.   CHECKLIST FROM ANNUAL WELLNESS VISIT:  -Follow up (please call to schedule if not scheduled after visit):   -yearly for annual wellness visit with primary care office  Here is a list of your preventive care/health maintenance measures and the plan for each if any are due:  PLAN For any measures below that may be due:    Health Maintenance  Topic Date Due   Hepatitis C Screening  Never done   INFLUENZA VACCINE  01/09/2023   COVID-19 Vaccine (4 - 2023-24 season) 02/09/2023   Medicare Annual Wellness (AWV)   02/13/2024   DTaP/Tdap/Td (3 - Td or Tdap) 03/15/2028   Colonoscopy  09/16/2029   Pneumonia Vaccine 19+ Years old  Completed   Zoster Vaccines- Shingrix  Completed   HPV VACCINES  Aged Out    -See a dentist at least yearly  -Get your eyes checked and then per your eye specialist's recommendations  -Other issues addressed today:   -I have included below further information regarding a healthy whole foods based diet, physical activity guidelines for adults, stress management and opportunities for social connections. I hope you find this information useful.   -----------------------------------------------------------------------------------------------------------------------------------------------------------------------------------------------------------------------------------------------------------  NUTRITION: -eat real food: lots of colorful vegetables (half the plate) and fruits -5-7 servings of vegetables and fruits per day (fresh or steamed is best), exp. 2 servings of vegetables with lunch and dinner and 2 servings of fruit per day. Berries and greens such as kale and collards are great choices.  -consume on a regular basis: whole grains (make sure first ingredient on label contains the word "whole"), fresh fruits, fish, nuts, seeds, healthy oils (such as olive oil, avocado oil, grape seed oil) -may eat small amounts of dairy and lean meat on occasion, but avoid processed meats such as ham, bacon, lunch meat, etc. -drink water -try to avoid fast food and pre-packaged foods, processed meat -most experts advise limiting sodium to < 2300mg  per day, should limit further is any chronic conditions such as high blood pressure, heart disease, diabetes, etc. The American Heart Association advised that < 1500mg  is is ideal -try to avoid foods that contain any ingredients with names you do not recognize  -try to avoid sugar/sweets (except for the natural sugar that occurs in fresh  fruit) -try to avoid sweet drinks -try to avoid white rice, white bread, pasta (unless whole grain), white or yellow potatoes  EXERCISE GUIDELINES FOR ADULTS: -if you wish to increase your physical activity, do so gradually and with the approval of your doctor -STOP and seek medical care immediately if you have any chest pain, chest discomfort or trouble breathing when starting or increasing exercise  -move and stretch your body, legs, feet and arms when sitting for long periods -Physical activity guidelines for optimal health in adults: -least 150 minutes per week of aerobic exercise (can talk, but not sing) once approved by your doctor, 20-30 minutes of sustained activity or two 10 minute episodes of sustained activity every day.  -resistance training at least 2 days per week if approved by your doctor -balance exercises 3+ days per week:   Stand somewhere where you have something sturdy to hold onto if you lose balance.    1) lift up on toes, start with 5x per day and work up to 20x   2) stand and lift on leg straight out to the side so that foot is a few inches of the floor, start with 5x each  side and work up to 20x each side   3) stand on one foot, start with 5 seconds each side and work up to 20 seconds on each side  If you need ideas or help with getting more active:  -Silver sneakers https://tools.silversneakers.com  -Walk with a Doc: http://www.duncan-williams.com/  -try to include resistance (weight lifting/strength building) and balance exercises twice per week: or the following link for ideas: http://castillo-powell.com/  BuyDucts.dk  STRESS MANAGEMENT: -can try meditating, or just sitting quietly with deep breathing while intentionally relaxing all parts of your body for 5 minutes daily -if you need further help with stress, anxiety or depression please follow up with your primary doctor  or contact the wonderful folks at [facility]: [phone]  SOCIAL CONNECTIONS: -options in [place] if you wish to engage in more social and exercise related activities:  -Silver sneakers https://tools.silversneakers.com  -Walk with a Doc: http://www.duncan-williams.com/  -Check out the [facility] Active Adults 50+ section on the [place] of [employer] (hiking clubs, book clubs, cards and games, chess, exercise classes, aquatic classes and much more) - see the website for details: https://www.[place].gov/departments/parks-recreation/active-adults50  -YouTube has lots of exercise videos for different ages and abilities as well  -[facility] (a variety of indoor and outdoor inperson activities for adults). ([phone])249-8911. [address].  -Virtual Online Classes (a variety of topics): see seniorplanet.org or call [phone]  -consider volunteering at a school, hospice center, church, senior center or elsewhere           Terressa Koyanagi, DO

## 2023-02-13 NOTE — Patient Instructions (Signed)
I really enjoyed getting to talk with you today! I am available on Tuesdays and Thursdays for virtual visits if you have any questions or concerns, or if I can be of any further assistance.   CHECKLIST FROM ANNUAL WELLNESS VISIT:  -Follow up (please call to schedule if not scheduled after visit):   -yearly for annual wellness visit with primary care office  Here is a list of your preventive care/health maintenance measures and the plan for each if any are due:  PLAN For any measures below that may be due:    Health Maintenance  Topic Date Due   Hepatitis C Screening  Never done   INFLUENZA VACCINE  01/09/2023   COVID-19 Vaccine (4 - 2023-24 season) 02/09/2023   Medicare Annual Wellness (AWV)  02/13/2024   DTaP/Tdap/Td (3 - Td or Tdap) 03/15/2028   Colonoscopy  09/16/2029   Pneumonia Vaccine 69+ Years old  Completed   Zoster Vaccines- Shingrix  Completed   HPV VACCINES  Aged Out    -See a dentist at least yearly  -Get your eyes checked and then per your eye specialist's recommendations  -Other issues addressed today:   -I have included below further information regarding a healthy whole foods based diet, physical activity guidelines for adults, stress management and opportunities for social connections. I hope you find this information useful.   -----------------------------------------------------------------------------------------------------------------------------------------------------------------------------------------------------------------------------------------------------------  NUTRITION: -eat real food: lots of colorful vegetables (half the plate) and fruits -5-7 servings of vegetables and fruits per day (fresh or steamed is best), exp. 2 servings of vegetables with lunch and dinner and 2 servings of fruit per day. Berries and greens such as kale and collards are great choices.  -consume on a regular basis: whole grains (make sure first ingredient on label  contains the word "whole"), fresh fruits, fish, nuts, seeds, healthy oils (such as olive oil, avocado oil, grape seed oil) -may eat small amounts of dairy and lean meat on occasion, but avoid processed meats such as ham, bacon, lunch meat, etc. -drink water -try to avoid fast food and pre-packaged foods, processed meat -most experts advise limiting sodium to < 2300mg  per day, should limit further is any chronic conditions such as high blood pressure, heart disease, diabetes, etc. The American Heart Association advised that < 1500mg  is is ideal -try to avoid foods that contain any ingredients with names you do not recognize  -try to avoid sugar/sweets (except for the natural sugar that occurs in fresh fruit) -try to avoid sweet drinks -try to avoid white rice, white bread, pasta (unless whole grain), white or yellow potatoes  EXERCISE GUIDELINES FOR ADULTS: -if you wish to increase your physical activity, do so gradually and with the approval of your doctor -STOP and seek medical care immediately if you have any chest pain, chest discomfort or trouble breathing when starting or increasing exercise  -move and stretch your body, legs, feet and arms when sitting for long periods -Physical activity guidelines for optimal health in adults: -least 150 minutes per week of aerobic exercise (can talk, but not sing) once approved by your doctor, 20-30 minutes of sustained activity or two 10 minute episodes of sustained activity every day.  -resistance training at least 2 days per week if approved by your doctor -balance exercises 3+ days per week:   Stand somewhere where you have something sturdy to hold onto if you lose balance.    1) lift up on toes, start with 5x per day and work up to 20x  2) stand and lift on leg straight out to the side so that foot is a few inches of the floor, start with 5x each side and work up to 20x each side   3) stand on one foot, start with 5 seconds each side and work up to  20 seconds on each side  If you need ideas or help with getting more active:  -Silver sneakers https://tools.silversneakers.com  -Walk with a Doc: http://www.duncan-williams.com/  -try to include resistance (weight lifting/strength building) and balance exercises twice per week: or the following link for ideas: http://castillo-powell.com/  BuyDucts.dk  STRESS MANAGEMENT: -can try meditating, or just sitting quietly with deep breathing while intentionally relaxing all parts of your body for 5 minutes daily -if you need further help with stress, anxiety or depression please follow up with your primary doctor or contact the wonderful folks at [facility]: [phone]  SOCIAL CONNECTIONS: -options in [place] if you wish to engage in more social and exercise related activities:  -Silver sneakers https://tools.silversneakers.com  -Walk with a Doc: http://www.duncan-williams.com/  -Check out the [facility] Active Adults 50+ section on the [place] of [employer] (hiking clubs, book clubs, cards and games, chess, exercise classes, aquatic classes and much more) - see the website for details: https://www.[place].gov/departments/parks-recreation/active-adults50  -YouTube has lots of exercise videos for different ages and abilities as well  -[facility] (a variety of indoor and outdoor inperson activities for adults). [phone]. [address].  -Virtual Online Classes (a variety of topics): see seniorplanet.org or call [phone]  -consider volunteering at a school, hospice center, church, senior center or elsewhere

## 2023-03-19 ENCOUNTER — Encounter: Payer: Self-pay | Admitting: Family Medicine

## 2023-03-19 ENCOUNTER — Ambulatory Visit: Payer: Medicare HMO | Admitting: Family Medicine

## 2023-03-19 VITALS — BP 120/70 | Temp 98.5°F | Resp 16 | Ht 67.5 in | Wt 217.4 lb

## 2023-03-19 DIAGNOSIS — T162XXA Foreign body in left ear, initial encounter: Secondary | ICD-10-CM | POA: Diagnosis not present

## 2023-03-19 NOTE — Progress Notes (Signed)
ACUTE VISIT Chief Complaint  Patient presents with   Ear Fullness    May have the rubber piece from his ear buds in his ear.    HPI: Micheal Hamilton is a 72 y.o. male with a PMHx significant for GERD, rectal bleeding, vertigo, HLD, anemia, depression, and prediabetes, who is here today complaining of an ear bud piece stuck in his left ear.  Sudden onset of left ear fullness after adjusting hearing aid. Problem has been constant. He believes he has lost the rubber piece from his ear buds in his left ear.  Negative for recent URI or travel.  He denies any fever,chills, ear drainage, unusual nasal congestion, or sore throat.  He has not tried OTC ear drops.  Review of Systems  Constitutional:  Negative for activity change, appetite change and fatigue.  Respiratory:  Negative for cough and wheezing.   Gastrointestinal:  Negative for nausea and vomiting.  Skin:  Negative for rash.  Neurological:  Negative for dizziness, syncope, weakness and headaches.  See other pertinent positives and negatives in HPI.  Current Outpatient Medications on File Prior to Visit  Medication Sig Dispense Refill   diclofenac (VOLTAREN) 75 MG EC tablet TAKE 1 TABLET BY MOUTH TWICE A DAY 180 tablet 3   ezetimibe (ZETIA) 10 MG tablet TAKE 1 TABLET BY MOUTH EVERY DAY 90 tablet 3   ferrous sulfate 325 (65 FE) MG tablet Take 1 tablet (325 mg total) by mouth 3 (three) times daily with meals. 270 tablet 3   meclizine (ANTIVERT) 25 MG tablet Take 1 tablet (25 mg total) by mouth every 4 (four) hours as needed for dizziness. 60 tablet 10   mometasone (ELOCON) 0.1 % cream Apply 1 Application topically 2 (two) times daily as needed. 45 g 6   sertraline (ZOLOFT) 100 MG tablet TAKE 1 TABLET BY MOUTH EVERY DAY 90 tablet 2   vitamin C (ASCORBIC ACID) 250 MG tablet Take 1 tablet (250 mg total) by mouth 3 (three) times daily. 270 tablet 3   Vitamin D, Ergocalciferol, (DRISDOL) 1.25 MG (50000 UNIT) CAPS capsule TAKE 1  CAPSULE BY MOUTH ONE TIME PER WEEK 12 capsule 3   zinc gluconate 50 MG tablet Take 50 mg by mouth daily.     No current facility-administered medications on file prior to visit.   Past Medical History:  Diagnosis Date   Allergy    occ seasonally    Arthritis    Colon polyps    hx of   Diverticulosis    GERD (gastroesophageal reflux disease)    Hyperlipidemia    Iron deficiency anemia    presumed from hemorrhoids which bleed daily (GI consult 2014)   Obesity    Pre-diabetes    no medicines    Allergies  Allergen Reactions   Crestor [Rosuvastatin]     REACTION: Myositis    Social History   Socioeconomic History   Marital status: Married    Spouse name: Not on file   Number of children: 3   Years of education: Not on file   Highest education level: Not on file  Occupational History   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, museum/gallery  Tobacco Use   Smoking status: Former    Current packs/day: 0.00    Average packs/day: 2.5 packs/day for 25.0 years (62.4 ttl pk-yrs)    Types: Cigarettes    Start date: 03/10/1964    Quit date: 06/10/1988    Years since quitting: 34.7  Smokeless tobacco: Never   Tobacco comments:    quit 25 years ago  Substance and Sexual Activity   Alcohol use: Yes   Drug use: No   Sexual activity: Not on file  Other Topics Concern   Not on file  Social History Narrative   Regular exercise- No   Social Determinants of Health   Financial Resource Strain: Low Risk  (02/07/2022)   Overall Financial Resource Strain (CARDIA)    Difficulty of Paying Living Expenses: Not hard at all  Food Insecurity: No Food Insecurity (02/07/2022)   Hunger Vital Sign    Worried About Running Out of Food in the Last Year: Never true    Ran Out of Food in the Last Year: Never true  Transportation Needs: No Transportation Needs (02/07/2022)   PRAPARE - Administrator, Civil Service (Medical): No    Lack of Transportation (Non-Medical): No   Physical Activity: Inactive (01/06/2023)   Exercise Vital Sign    Days of Exercise per Week: 0 days    Minutes of Exercise per Session: 0 min  Stress: No Stress Concern Present (02/07/2022)   [employer] of Occupational Health - Occupational Stress Questionnaire    Feeling of Stress : Not at all  Social Connections: Moderately Integrated (02/07/2022)   Social Connection and Isolation Panel [NHANES]    Frequency of Communication with Friends and Family: More than three times a week    Frequency of Social Gatherings with Friends and Family: More than three times a week    Attends Religious Services: Never    Database administrator or Organizations: No    Attends Engineer, structural: More than 4 times per year    Marital Status: Married    Vitals:   03/19/23 1344  BP: 120/70  Resp: 16  Temp: 98.5 F (36.9 C)   Body mass index is 33.54 kg/m.  Physical Exam Vitals and nursing note reviewed.  Constitutional:      General: He is not in acute distress.    Appearance: He is well-developed.  HENT:     Head: Normocephalic and atraumatic.     Left Ear: External ear normal. A foreign body is present.     Ears:     Comments: Wallace Cullens foreign, soft, object occupying the whole diameter of ear canal, lodged in the middle of ear canal. Eyes:     Conjunctiva/sclera: Conjunctivae normal.  Pulmonary:     Effort: Pulmonary effort is normal. No respiratory distress.  Skin:    General: Skin is warm.     Findings: No erythema or rash.  Neurological:     General: No focal deficit present.     Mental Status: He is oriented to person, place, and time.  Psychiatric:        Mood and Affect: Mood and affect normal.    ASSESSMENT AND PLAN:  [name] was seen today for an ear bud rubber piece stuck in his left ear.   Foreign body of left ear, initial encounter -     Foreign Body Removal  After verbal consent and discussion of possible complications,he agrees with proceed with  procedure. After manipulating object with curette and gauge needle 18 trying to dislodge, I was able to grap it with a needle holder and extract object.  Foreign Body Removal  Date/Time: 03/19/2023 2:17 PM  Performed by: [place], Jamesen Stahnke G, MD Authorized by: [place], Tomisha Reppucci G, MD  Body area: ear  Sedation: Patient sedated: no  Patient restrained: no Localization method: visualized Removal mechanism: bayonet forceps Complexity: simple 1 objects recovered. Objects recovered: ear plug Post-procedure assessment: foreign body removed Patient tolerance: patient tolerated the procedure well with no immediate complications  Return if symptoms worsen or fail to improve.  Bernice Mcauliffe G. [place], MD  [facility]. [place] office.

## 2023-03-25 ENCOUNTER — Telehealth: Payer: Self-pay | Admitting: Family Medicine

## 2023-03-25 NOTE — Telephone Encounter (Signed)
Pt called to request an increase in the dosage of the sertraline (ZOLOFT) 100 MG tablets Pt states he has about a week's worth left.   [id number] [place], [place] - [address] Phone: [phone]  Fax: [phone]

## 2023-03-26 NOTE — Telephone Encounter (Signed)
Spoke with pt advised that he has enough refill in the pharmacy

## 2023-05-06 ENCOUNTER — Telehealth: Payer: Self-pay | Admitting: Family Medicine

## 2023-05-06 NOTE — Telephone Encounter (Signed)
Please advise 

## 2023-05-06 NOTE — Telephone Encounter (Signed)
Call in a Zpack  ?

## 2023-05-06 NOTE — Telephone Encounter (Signed)
Pt has a cough for a couple days and sluggishness and grandson has ear infection and stuffiness and he was exposed. Pt is going out of town tomorrow  [id number] - [place], [place] - [address] Phone: [phone]  Fax: [phone]    Pt said he does not have the time for an appt

## 2023-05-07 NOTE — Telephone Encounter (Signed)
Pt notified via My Chart

## 2023-06-25 ENCOUNTER — Telehealth: Payer: Self-pay

## 2023-06-25 NOTE — Telephone Encounter (Signed)
 Copied from CRM [id number]. Topic: Clinical - Medication Question >> Jun 25, 2023 11:21 AM Corin V wrote: Reason for CRM: Patient is calling in asking for an increase in the dose for his Zoloft  Rx. He stated that Dr. Alyne Babinski had mentioned at the last visit that this would be possible due to stress of his wife's cancer diagnosis and they are needing a little extra help from the Zoloft .

## 2023-06-25 NOTE — Telephone Encounter (Signed)
 Duplicate message, sent to Dr Alyne Babinski for advise

## 2023-06-27 ENCOUNTER — Telehealth: Payer: Self-pay

## 2023-06-30 MED ORDER — SERTRALINE HCL 50 MG PO TABS: 50.0000 mg | ORAL_TABLET | Freq: Every day | ORAL | 3 refills | Status: DC

## 2023-06-30 MED ORDER — SERTRALINE HCL 100 MG PO TABS: 100.0000 mg | ORAL_TABLET | Freq: Every day | ORAL | 2 refills | Status: DC

## 2023-06-30 NOTE — Telephone Encounter (Signed)
Pt notified via My Chart

## 2023-06-30 NOTE — Telephone Encounter (Signed)
I sent in a RX for the 50 mg dose, so he can take 150 mg daily

## 2023-07-07 ENCOUNTER — Encounter: Payer: Self-pay | Admitting: Family Medicine

## 2023-07-07 ENCOUNTER — Telehealth (INDEPENDENT_AMBULATORY_CARE_PROVIDER_SITE_OTHER): Payer: Medicare HMO | Admitting: Family Medicine

## 2023-07-07 VITALS — Ht 67.5 in

## 2023-07-07 DIAGNOSIS — R062 Wheezing: Secondary | ICD-10-CM | POA: Diagnosis not present

## 2023-07-07 DIAGNOSIS — J069 Acute upper respiratory infection, unspecified: Secondary | ICD-10-CM | POA: Diagnosis not present

## 2023-07-07 DIAGNOSIS — Z20828 Contact with and (suspected) exposure to other viral communicable diseases: Secondary | ICD-10-CM

## 2023-07-07 MED ORDER — PREDNISONE 20 MG PO TABS: 40.0000 mg | ORAL_TABLET | Freq: Every day | ORAL | 0 refills | Status: AC

## 2023-07-07 MED ORDER — OSELTAMIVIR PHOSPHATE 75 MG PO CAPS
75.0000 mg | ORAL_CAPSULE | Freq: Two times a day (BID) | ORAL | 0 refills | Status: AC
Start: 2023-07-07 — End: 2023-07-12

## 2023-07-07 MED ORDER — ALBUTEROL SULFATE HFA 108 (90 BASE) MCG/ACT IN AERS
2.0000 | INHALATION_SPRAY | Freq: Four times a day (QID) | RESPIRATORY_TRACT | 0 refills | Status: AC | PRN
Start: 2023-07-07 — End: ?

## 2023-07-07 NOTE — Progress Notes (Signed)
Virtual Visit via Video Note I connected with Micheal Hamilton on 07/07/2023 by a video enabled telemedicine application and verified that I am speaking with the correct person using two identifiers. Location patient: home Location provider:work office Persons participating in the virtual visit: patient, scribe,provider  I discussed the limitations of evaluation and management by telemedicine and the availability of in person appointments. The patient expressed understanding and agreed to proceed.  Chief Complaint  Patient presents with   Cough    Started Saturday. Grandson has influenza A    bodyaches   HPI: Micheal Hamilton is a 73 y.o. male with a PMHx significant for GERD, benign positional vertigo, vitamin D deficiency, prediabetes, iron deficiency anemia, HLD, ED, anxiety, and depression, who is being seen on video today for cough and body aches.   Patient states he has been sick with a cough since 1/25. He endorses associated subjective fever, chills, mild dizziness, loss of appetite, sleeping difficulty, headache, sudoresis, some chest pain while coughing, and epiphora. Cough This is a new problem. The current episode started in the past 7 days. The problem has been unchanged. The cough is Productive of sputum. Associated symptoms include a fever, myalgias, postnasal drip, rhinorrhea and a sore throat. Pertinent negatives include no chest pain, chills, ear congestion, ear pain, heartburn, hemoptysis, nasal congestion, rash or weight loss. He has tried OTC cough suppressant for the symptoms. The treatment provided mild relief. His past medical history is significant for environmental allergies. There is no history of asthma or COPD.   His cough is productive, light greenish sputum. Clear rhinorrhea.  Also has had some SOB and wheezing with exertion. No associated CP, palpitation, or diaphoresis. No hx of asthma or COPD.  He has been taking Nyquil. Also taking Advil for the  headache, which has helped. He reports that headache has resolved.  He mentions his grandson was recently dx'ed with flu.  Pertinent negatives include abdominal pain, nausea, vomiting, diarrhea, or urinary symptoms. He mentions intermittent blurry vision, which he attributes to watery eyes.  Negative for eye pain or diplopia.  ROS: See pertinent positives and negatives per HPI.  Past Medical History:  Diagnosis Date   Allergy    occ seasonally    Arthritis    Colon polyps    hx of   Diverticulosis    GERD (gastroesophageal reflux disease)    Hyperlipidemia    Iron deficiency anemia    presumed from hemorrhoids which bleed daily (GI consult 2014)   Obesity    Pre-diabetes    no medicines     Past Surgical History:  Procedure Laterality Date   BAND HEMORRHOIDECTOMY     COLONOSCOPY  09/17/2019   per Dr. Christella Hartigan, no polyps, repeat in 10  yrs   edg  2004   Dr Madilyn Fireman- hiatal hernia   POLYPECTOMY     TONSILLECTOMY     UPPER GASTROINTESTINAL ENDOSCOPY      Family History  Problem Relation Age of Onset   Lung cancer Father    Cancer Father        lung   Crohn's disease Sister    Diabetes Cousin    Heart disease Maternal Uncle    Colon cancer Neg Hx    Colon polyps Neg Hx    Esophageal cancer Neg Hx    Stomach cancer Neg Hx    Rectal cancer Neg Hx     Social History   Socioeconomic History   Marital status: Married  Spouse name: Not on file   Number of children: 3   Years of education: Not on file   Highest education level: Bachelor's degree (e.g., BA, AB, BS)  Occupational History   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, museum/gallery  Tobacco Use   Smoking status: Former    Current packs/day: 0.00    Average packs/day: 2.5 packs/day for 25.0 years (62.4 ttl pk-yrs)    Types: Cigarettes    Start date: 03/10/1964    Quit date: 06/10/1988    Years since quitting: 35.0   Smokeless tobacco: Never   Tobacco comments:    quit 25 years ago  Substance  and Sexual Activity   Alcohol use: Yes   Drug use: No   Sexual activity: Not on file  Other Topics Concern   Not on file  Social History Narrative   Regular exercise- No   Social Drivers of Health   Financial Resource Strain: Low Risk  (07/07/2023)   Overall Financial Resource Strain (CARDIA)    Difficulty of Paying Living Expenses: Not very hard  Food Insecurity: No Food Insecurity (07/07/2023)   Hunger Vital Sign    Worried About Running Out of Food in the Last Year: Never true    Ran Out of Food in the Last Year: Never true  Transportation Needs: No Transportation Needs (07/07/2023)   PRAPARE - Administrator, Civil Service (Medical): No    Lack of Transportation (Non-Medical): No  Physical Activity: Inactive (07/07/2023)   Exercise Vital Sign    Days of Exercise per Week: 0 days    Minutes of Exercise per Session: 0 min  Stress: Stress Concern Present (07/07/2023)   [employer] of Occupational Health - Occupational Stress Questionnaire    Feeling of Stress : To some extent  Social Connections: Moderately Integrated (07/07/2023)   Social Connection and Isolation Panel [NHANES]    Frequency of Communication with Friends and Family: More than three times a week    Frequency of Social Gatherings with Friends and Family: Once a week    Attends Religious Services: 1 to 4 times per year    Active Member of [employer] or Organizations: No    Attends Banker Meetings: Not on file    Marital Status: Married  Catering manager Violence: Not At Risk (02/07/2022)   Humiliation, Afraid, Rape, and Kick questionnaire    Fear of Current or Ex-Partner: No    Emotionally Abused: No    Physically Abused: No    Sexually Abused: No     Current Outpatient Medications:    albuterol (VENTOLIN HFA) 108 (90 Base) MCG/ACT inhaler, Inhale 2 puffs into the lungs every 6 (six) hours as needed for wheezing or shortness of breath., Disp: 8 g, Rfl: 0   diclofenac (VOLTAREN) 75 MG  EC tablet, TAKE 1 TABLET BY MOUTH TWICE A DAY, Disp: 180 tablet, Rfl: 3   ezetimibe (ZETIA) 10 MG tablet, TAKE 1 TABLET BY MOUTH EVERY DAY, Disp: 90 tablet, Rfl: 3   ferrous sulfate 325 (65 FE) MG tablet, Take 1 tablet (325 mg total) by mouth 3 (three) times daily with meals., Disp: 270 tablet, Rfl: 3   meclizine (ANTIVERT) 25 MG tablet, Take 1 tablet (25 mg total) by mouth every 4 (four) hours as needed for dizziness., Disp: 60 tablet, Rfl: 10   mometasone (ELOCON) 0.1 % cream, Apply 1 Application topically 2 (two) times daily as needed., Disp: 45 g, Rfl: 6   oseltamivir (TAMIFLU)  75 MG capsule, Take 1 capsule (75 mg total) by mouth 2 (two) times daily for 5 days., Disp: 10 capsule, Rfl: 0   predniSONE (DELTASONE) 20 MG tablet, Take 2 tablets (40 mg total) by mouth daily with breakfast for 5 days., Disp: 10 tablet, Rfl: 0   sertraline (ZOLOFT) 100 MG tablet, Take 1 tablet (100 mg total) by mouth daily., Disp: 90 tablet, Rfl: 2   sertraline (ZOLOFT) 50 MG tablet, Take 1 tablet (50 mg total) by mouth daily., Disp: 90 tablet, Rfl: 3   vitamin C (ASCORBIC ACID) 250 MG tablet, Take 1 tablet (250 mg total) by mouth 3 (three) times daily., Disp: 270 tablet, Rfl: 3   Vitamin D, Ergocalciferol, (DRISDOL) 1.25 MG (50000 UNIT) CAPS capsule, TAKE 1 CAPSULE BY MOUTH ONE TIME PER WEEK, Disp: 12 capsule, Rfl: 3   zinc gluconate 50 MG tablet, Take 50 mg by mouth daily., Disp: , Rfl:   EXAM:  VITALS per patient if applicable:Ht 5' 7.5" (1.715 m)   BMI 33.54 kg/m   GENERAL: alert, oriented, appears well and in no acute distress  HEENT: atraumatic, conjunctiva clear, no obvious abnormalities on inspection of external nose and ears  NECK: normal movements of the head and neck  LUNGS: on inspection no signs of respiratory distress, breathing rate appears normal, no obvious gross SOB, gasping or wheezing  CV: no obvious cyanosis  MS: moves all visible extremities without noticeable  abnormality  PSYCH/NEURO: pleasant and cooperative, no obvious depression or anxiety, speech and thought processing grossly intact  ASSESSMENT AND PLAN:  Discussed the following assessment and plan:  Exposure to influenza - Plan: oseltamivir (TAMIFLU) 75 MG capsule  URI, acute - Plan: oseltamivir (TAMIFLU) 75 MG capsule Symptoms suggest a viral illness, because recent exposure to influenza, there is a high probability that he may have influenza as well.  So recommend treatment with Tamiflu 75 mg twice daily for 5 days.  Today is the second day of illness, so he needs to start antiviral medication as soon as possible. Explained that cough and congestion can last a few more days and even weeks after acute symptoms have resolved. Symptomatic treatment with plenty of fluids, rest, Tylenol 500 mg 3-4 times per day as needed for body aches. Instructed about warning signs.  Wheezing - Plan: predniSONE (DELTASONE) 20 MG tablet, albuterol (VENTOLIN HFA) 108 (90 Base) MCG/ACT inhaler With associated shortness of breath, exacerbated with exertion. I do not appreciate respiratory distress or wheezing during visit, nonproductive cough with forced expiration. Recommend prednisone 40 mg daily with breakfast for 3 to 5 days.  Son side effect discussed. Albuterol inhaler 2 puff every 6 hours for 7 days then as needed. Clearly instructed about warning signs.  -In regard to blurry vision, epiphora can be a contributing factor.  Recommend arranging an appointment with his eye care provider if symptoms do not improve.  To seek immediate medical attention if eye pain or sudden decrease of vision happen.  We discussed possible serious and likely etiologies, options for evaluation and workup, limitations of telemedicine visit vs in person visit, treatment, treatment risks and precautions. The patient was advised to call back or seek an in-person evaluation if the symptoms worsen or if the condition fails to  improve as anticipated. I discussed the assessment and treatment plan with the patient. The patient was provided an opportunity to ask questions and all were answered. The patient agreed with the plan and demonstrated an understanding of the instructions.  Return if  symptoms worsen or fail to improve.  I, Rolla Etienne Wierda, acting as a scribe for Morrison Mcbryar [place], MD., have documented all relevant documentation on the behalf of Durga Saldarriaga [place], MD, as directed by  Ciclaly Mulcahey [place], MD while in the presence of Vy Badley [place], MD.   I, Tova Vater [place], MD, have reviewed all documentation for this visit. The documentation on 07/07/23 for the exam, diagnosis, procedures, and orders are all accurate and complete.  Mivaan Corbitt [place], MD

## 2023-07-15 ENCOUNTER — Ambulatory Visit: Payer: Medicare HMO

## 2023-07-15 ENCOUNTER — Ambulatory Visit (INDEPENDENT_AMBULATORY_CARE_PROVIDER_SITE_OTHER): Payer: Medicare HMO | Admitting: Family Medicine

## 2023-07-15 ENCOUNTER — Encounter: Payer: Self-pay | Admitting: Family Medicine

## 2023-07-15 VITALS — BP 130/72 | HR 92 | Temp 98.6°F | Ht 70.0 in | Wt 211.2 lb

## 2023-07-15 DIAGNOSIS — R059 Cough, unspecified: Secondary | ICD-10-CM | POA: Diagnosis not present

## 2023-07-15 DIAGNOSIS — J22 Unspecified acute lower respiratory infection: Secondary | ICD-10-CM | POA: Diagnosis not present

## 2023-07-15 DIAGNOSIS — R6889 Other general symptoms and signs: Secondary | ICD-10-CM | POA: Diagnosis not present

## 2023-07-15 DIAGNOSIS — R918 Other nonspecific abnormal finding of lung field: Secondary | ICD-10-CM | POA: Diagnosis not present

## 2023-07-15 LAB — POCT INFLUENZA A/B
Influenza A, POC: NEGATIVE
Influenza B, POC: NEGATIVE

## 2023-07-15 LAB — POC COVID19 BINAXNOW: SARS Coronavirus 2 Ag: NEGATIVE

## 2023-07-15 MED ORDER — PROMETHAZINE-DM 6.25-15 MG/5ML PO SYRP: 5.0000 mL | ORAL_SOLUTION | Freq: Four times a day (QID) | ORAL | 0 refills | Status: AC | PRN

## 2023-07-15 MED ORDER — DOXYCYCLINE HYCLATE 100 MG PO TABS: 100.0000 mg | ORAL_TABLET | Freq: Two times a day (BID) | ORAL | 0 refills | Status: AC

## 2023-07-15 NOTE — Progress Notes (Signed)
 Acute Office Visit   Subjective:  Patient ID: Micheal Hamilton, male    DOB: 23-Nov-1950, 73 y.o.   MRN: [id number]  Chief Complaint  Patient presents with   Cough    Still having cough congestion and runny nose for a little over a week almost 2 weeks     HPI:  Patient is being seen for continued cough, chest congestion, nasal drainage, fever, chills, sweats, headache, sore throat, wheezing at night, and SHOB on exertion.Cough varies between productive and nonproductive. Sputum is described as light green, varies between thick and thin.  Denies chest pain or ear pain or drainage. Denies nausea and vomiting.   Patient had a virtual appointment on 01/27 with Dr. Betty Jordan. He was diagnosed with exposure to influenza, acute URI, and wheezing. He was prescribed Albuterol  Inhaler, Prednisone , and Tamiflu . He reports he took full course of Prednsione and Tamiflu . He reports he has not took any other medication for his symptoms since the virtual visit.   Review of Systems  Respiratory:  Positive for cough.   See HPI above      Objective:   BP 130/72 (BP Location: Left Arm, Patient Position: Sitting, Cuff Size: Normal)   Pulse 92   Temp 98.6 F (37 C) (Oral)   Ht 5' 10 (1.778 m)   Wt 211 lb 3.2 oz (95.8 kg)   SpO2 94%   BMI 30.30 kg/m     Physical Exam Vitals reviewed.  Constitutional:      General: He is not in acute distress.    Appearance: Normal appearance. He is ill-appearing (Mild). He is not toxic-appearing or diaphoretic.  HENT:     Head: Normocephalic and atraumatic.  Eyes:     General:        Right eye: No discharge.        Left eye: No discharge.     Conjunctiva/sclera: Conjunctivae normal.  Cardiovascular:     Rate and Rhythm: Normal rate and regular rhythm.     Heart sounds: Normal heart sounds. No murmur heard.    No friction rub. No gallop.  Pulmonary:     Effort: Pulmonary effort is normal. No respiratory distress.     Breath sounds: Rales (All lung  fields posterior and upper/mid anterior) present.  Musculoskeletal:        General: Normal range of motion.  Skin:    General: Skin is warm and dry.  Neurological:     General: No focal deficit present.     Mental Status: He is alert and oriented to person, place, and time. Mental status is at baseline.  Psychiatric:        Mood and Affect: Mood normal.        Behavior: Behavior normal.        Thought Content: Thought content normal.        Judgment: Judgment normal.     Latest Reference Range & Units 07/15/23 16:13 07/15/23 16:14  Influenza A, POC Negative  Negative   Influenza B, POC Negative  Negative   SARS Coronavirus 2 Ag Negative   Negative      Assessment & Plan:  Cough, unspecified type -     POC COVID-19 BinaxNow -     DG Chest 2 View; Future -     Promethazine -DM; Take 5 mLs by mouth 4 (four) times daily as needed.  Dispense: 118 mL; Refill: 0  Flu-like symptoms -     POCT Influenza A/B  Acute respiratory  infection -     Doxycycline  Hyclate; Take 1 tablet (100 mg total) by mouth 2 (two) times daily for 7 days.  Dispense: 14 tablet; Refill: 0 -     Promethazine -DM; Take 5 mLs by mouth 4 (four) times daily as needed.  Dispense: 118 mL; Refill: 0  -Negative for influenza and covid.  -Ordered chest x-ray for continued cough. Office will call with results and you will see them on MyChart. -Since symptoms have been going on for a week with no improvement, prescribed Doxycycline  100mg  tablet, take 1 tablet 2 times a day for 7 days.  -Prescribed Promethazine -DM cough syrup, 5mL four times a day as needed. This medication can cause drowiness.  -Continue to use Albuterol  Inhaler as needed for cough, wheezing, and shortness of breath.  -Advised if he develops severe shortness of breath or chest pain, go directly to the closes emergency department. If symptoms do not improve after antibiotic, please follow up.   Eleshia Wooley, NP

## 2023-07-15 NOTE — Patient Instructions (Addendum)
 It was pleasure to care for you.  Negative for influenza and covid.  Ordered chest x-ray. Office will call with results and you will see them on MyChart. Since symptoms have been going on for a week with no improvement, prescribed Doxycycline  100mg  tablet, take 1 tablet 2 times a day for 7 days.  Prescribed Promethazine -DM cough syrup, 5mL four times a day as needed. This medication can cause drowiness.  Continue to use Albuterol  Inhaler as needed for cough, wheezing, and shortness of breath.  If you develop severe shortness of breath or chest pain, go directly to the closes emergency department. If symptoms do not improve after antibiotic, please follow up.

## 2023-07-17 ENCOUNTER — Encounter: Payer: Self-pay | Admitting: Family Medicine

## 2023-07-21 ENCOUNTER — Encounter: Payer: Self-pay | Admitting: Family Medicine

## 2023-07-21 ENCOUNTER — Other Ambulatory Visit: Payer: Self-pay | Admitting: Family Medicine

## 2023-07-21 DIAGNOSIS — R059 Cough, unspecified: Secondary | ICD-10-CM

## 2023-07-21 MED ORDER — PREDNISONE 10 MG PO TABS: ORAL_TABLET | ORAL | 0 refills | Status: AC

## 2023-07-21 NOTE — Progress Notes (Signed)
 Chest x-ray resulted with bronchial thickening. He feels some better than last week, but still has a cough.

## 2023-08-26 DIAGNOSIS — M170 Bilateral primary osteoarthritis of knee: Secondary | ICD-10-CM | POA: Diagnosis not present

## 2023-09-11 ENCOUNTER — Other Ambulatory Visit: Payer: Self-pay | Admitting: Family Medicine

## 2023-09-11 DIAGNOSIS — E559 Vitamin D deficiency, unspecified: Secondary | ICD-10-CM

## 2023-09-16 DIAGNOSIS — M170 Bilateral primary osteoarthritis of knee: Secondary | ICD-10-CM | POA: Diagnosis not present

## 2023-09-16 DIAGNOSIS — S83241A Other tear of medial meniscus, current injury, right knee, initial encounter: Secondary | ICD-10-CM | POA: Diagnosis not present

## 2023-09-24 ENCOUNTER — Other Ambulatory Visit: Payer: Self-pay | Admitting: Family Medicine

## 2023-09-29 NOTE — Telephone Encounter (Signed)
 Contacted pharmacy. They inform that pt only has 117 tablets left and it would not be 90 days supply. Would need to send in a refill.   Attempted to reach pt if pt wants to get 117 tablets fill then we can send in the next cycle when it is close to running out.   Left a voicemail to call us  back.

## 2023-09-30 DIAGNOSIS — M25561 Pain in right knee: Secondary | ICD-10-CM | POA: Diagnosis not present

## 2023-10-08 DIAGNOSIS — M25561 Pain in right knee: Secondary | ICD-10-CM | POA: Diagnosis not present

## 2023-10-29 DIAGNOSIS — S83241D Other tear of medial meniscus, current injury, right knee, subsequent encounter: Secondary | ICD-10-CM | POA: Diagnosis not present

## 2023-11-20 ENCOUNTER — Encounter: Payer: Self-pay | Admitting: Family Medicine

## 2023-11-20 ENCOUNTER — Ambulatory Visit (INDEPENDENT_AMBULATORY_CARE_PROVIDER_SITE_OTHER): Admitting: Family Medicine

## 2023-11-20 VITALS — BP 108/62 | HR 73 | Temp 98.2°F | Wt 210.0 lb

## 2023-11-20 DIAGNOSIS — H00011 Hordeolum externum right upper eyelid: Secondary | ICD-10-CM

## 2023-11-20 MED ORDER — ERYTHROMYCIN 5 MG/GM OP OINT: 1.0000 | TOPICAL_OINTMENT | Freq: Four times a day (QID) | OPHTHALMIC | 1 refills | Status: AC

## 2023-11-20 NOTE — Progress Notes (Signed)
   Subjective:    Patient ID: MARQUICE UDDIN, male    DOB: 1950/06/17, 73 y.o.   MRN: [id number]  HPI Here for a painful stye on the right upper eyelid that appeared 2 weeks ago. He has been applying hot compresses. He also asks me to check his left ear canal. He noticed a few days ago that the canal dome of his hearing aid was missing. He has no symptoms in the ear.    Review of Systems  Constitutional: Negative.   HENT: Negative.    Eyes:  Negative for discharge, redness and visual disturbance.  Respiratory: Negative.         Objective:   Physical Exam Constitutional:      Appearance: Normal appearance.  HENT:     Right Ear: Tympanic membrane, ear canal and external ear normal.     Left Ear: Tympanic membrane, ear canal and external ear normal.     Nose: Nose normal.     Mouth/Throat:     Pharynx: Oropharynx is clear.   Eyes:     Conjunctiva/sclera: Conjunctivae normal.     Comments: There is a tender papular lesion at the right upper eyelid edge    Neurological:     Mental Status: He is alert.           Assessment & Plan:  We will treat the stye with Erythromycin ophthalmic ointment. I reassured him that his hearing aid dome was not in the ear canal.  Corita Diego, MD

## 2023-11-26 DIAGNOSIS — M25561 Pain in right knee: Secondary | ICD-10-CM | POA: Diagnosis not present

## 2023-11-26 DIAGNOSIS — S83241D Other tear of medial meniscus, current injury, right knee, subsequent encounter: Secondary | ICD-10-CM | POA: Diagnosis not present

## 2023-12-21 ENCOUNTER — Other Ambulatory Visit: Payer: Self-pay | Admitting: Family Medicine

## 2024-02-12 DIAGNOSIS — M170 Bilateral primary osteoarthritis of knee: Secondary | ICD-10-CM | POA: Diagnosis not present

## 2024-02-12 DIAGNOSIS — S83241A Other tear of medial meniscus, current injury, right knee, initial encounter: Secondary | ICD-10-CM | POA: Diagnosis not present

## 2024-03-17 DIAGNOSIS — M1711 Unilateral primary osteoarthritis, right knee: Secondary | ICD-10-CM | POA: Diagnosis not present

## 2024-03-19 ENCOUNTER — Other Ambulatory Visit: Payer: Self-pay | Admitting: Family Medicine

## 2024-04-05 ENCOUNTER — Telehealth: Payer: Self-pay

## 2024-04-05 NOTE — Telephone Encounter (Signed)
 Returned [place] on the [facility] call regarding pt clearance form left a message to refax the form to our office. Copied from CRM [id number]. Topic: General - Other >> Mar 31, 2024  9:25 AM Antwanette L wrote: Reason for CRM: Joen from [facility] called regarding a request for pre-operative clearance. The clearance was faxed over on 10/8. She can be reached at [phone].

## 2024-04-07 ENCOUNTER — Encounter: Payer: Self-pay | Admitting: Family Medicine

## 2024-04-07 ENCOUNTER — Ambulatory Visit: Admitting: Family Medicine

## 2024-04-07 VITALS — BP 110/70 | HR 75 | Temp 98.2°F | Ht <= 58 in | Wt 209.0 lb

## 2024-04-07 DIAGNOSIS — E559 Vitamin D deficiency, unspecified: Secondary | ICD-10-CM

## 2024-04-07 DIAGNOSIS — N401 Enlarged prostate with lower urinary tract symptoms: Secondary | ICD-10-CM | POA: Diagnosis not present

## 2024-04-07 DIAGNOSIS — E782 Mixed hyperlipidemia: Secondary | ICD-10-CM | POA: Diagnosis not present

## 2024-04-07 DIAGNOSIS — D509 Iron deficiency anemia, unspecified: Secondary | ICD-10-CM | POA: Diagnosis not present

## 2024-04-07 DIAGNOSIS — Z01818 Encounter for other preprocedural examination: Secondary | ICD-10-CM

## 2024-04-07 DIAGNOSIS — N138 Other obstructive and reflux uropathy: Secondary | ICD-10-CM

## 2024-04-07 DIAGNOSIS — F418 Other specified anxiety disorders: Secondary | ICD-10-CM

## 2024-04-07 DIAGNOSIS — R7303 Prediabetes: Secondary | ICD-10-CM | POA: Diagnosis not present

## 2024-04-07 LAB — CBC WITH DIFFERENTIAL/PLATELET
Basophils Absolute: 0 K/uL (ref 0.0–0.1)
Basophils Relative: 0.4 % (ref 0.0–3.0)
Eosinophils Absolute: 0.2 K/uL (ref 0.0–0.7)
Eosinophils Relative: 1.8 % (ref 0.0–5.0)
HCT: 51.3 % (ref 39.0–52.0)
Hemoglobin: 16.9 g/dL (ref 13.0–17.0)
Lymphocytes Relative: 13.7 % (ref 12.0–46.0)
Lymphs Abs: 1.2 K/uL (ref 0.7–4.0)
MCHC: 33 g/dL (ref 30.0–36.0)
MCV: 82.6 fl (ref 78.0–100.0)
Monocytes Absolute: 0.9 K/uL (ref 0.1–1.0)
Monocytes Relative: 10.1 % (ref 3.0–12.0)
Neutro Abs: 6.6 K/uL (ref 1.4–7.7)
Neutrophils Relative %: 74 % (ref 43.0–77.0)
Platelets: 326 K/uL (ref 150.0–400.0)
RBC: 6.2 Mil/uL — ABNORMAL HIGH (ref 4.22–5.81)
RDW: 14.4 % (ref 11.5–15.5)
WBC: 9 K/uL (ref 4.0–10.5)

## 2024-04-07 LAB — LIPID PANEL
Cholesterol: 232 mg/dL — ABNORMAL HIGH (ref 0–200)
HDL: 41 mg/dL (ref >39.00)
LDL Cholesterol: 137 mg/dL — ABNORMAL HIGH (ref 0–99)
NonHDL: 191.08
Total CHOL/HDL Ratio: 6
Triglycerides: 271 mg/dL — ABNORMAL HIGH (ref 0.0–149.0)
VLDL: 54.2 mg/dL — ABNORMAL HIGH (ref 0.0–40.0)

## 2024-04-07 LAB — HEPATIC FUNCTION PANEL
ALT: 15 U/L (ref 0–53)
AST: 17 U/L (ref 0–37)
Albumin: 4.9 g/dL (ref 3.5–5.2)
Alkaline Phosphatase: 70 U/L (ref 39–117)
Bilirubin, Direct: 0.1 mg/dL (ref 0.0–0.3)
Total Bilirubin: 0.7 mg/dL (ref 0.2–1.2)
Total Protein: 7.5 g/dL (ref 6.0–8.3)

## 2024-04-07 LAB — BASIC METABOLIC PANEL WITH GFR
BUN: 15 mg/dL (ref 6–23)
CO2: 30 meq/L (ref 19–32)
Calcium: 9.9 mg/dL (ref 8.4–10.5)
Chloride: 102 meq/L (ref 96–112)
Creatinine, Ser: 0.81 mg/dL (ref 0.40–1.50)
GFR: 87.91 mL/min (ref >60.00)
Glucose, Bld: 101 mg/dL — ABNORMAL HIGH (ref 70–99)
Potassium: 4.6 meq/L (ref 3.5–5.1)
Sodium: 139 meq/L (ref 135–145)

## 2024-04-07 LAB — IBC + FERRITIN
Ferritin: 262.9 ng/mL (ref 22.0–322.0)
Iron: 72 ug/dL (ref 42–165)
Saturation Ratios: 21.6 % (ref 20.0–50.0)
TIBC: 333.2 ug/dL (ref 250.0–450.0)
Transferrin: 238 mg/dL (ref 212.0–360.0)

## 2024-04-07 LAB — HEMOGLOBIN A1C: Hgb A1c MFr Bld: 6.3 % (ref 4.6–6.5)

## 2024-04-07 LAB — VITAMIN D 25 HYDROXY (VIT D DEFICIENCY, FRACTURES): VITD: 42.1 ng/mL (ref 30.00–100.00)

## 2024-04-07 LAB — TSH: TSH: 1.19 u[IU]/mL (ref 0.35–5.50)

## 2024-04-07 LAB — PSA: PSA: 4.71 ng/mL — ABNORMAL HIGH (ref 0.10–4.00)

## 2024-04-07 NOTE — Progress Notes (Signed)
 Subjective:    Patient ID: Micheal Hamilton, male    DOB: [birth year], 73 y.o.   MRN: [id number]  HPI Here to follow up on issues. He feels fine in general, but he has bilateral knee pain. He is planning on getting a right knee arthroplasty per Dr. Josefina soon, and they are asking us  for medical clearance.    Review of Systems  Constitutional: Negative.   HENT: Negative.    Eyes: Negative.   Respiratory: Negative.    Cardiovascular: Negative.   Gastrointestinal: Negative.   Genitourinary: Negative.   Musculoskeletal:  Positive for arthralgias.  Skin: Negative.   Neurological: Negative.   Psychiatric/Behavioral: Negative.         Objective:   Physical Exam Constitutional:      General: He is not in acute distress.    Appearance: Normal appearance. He is well-developed. He is not diaphoretic.  HENT:     Head: Normocephalic and atraumatic.     Right Ear: External ear normal.     Left Ear: External ear normal.     Nose: Nose normal.     Mouth/Throat:     Pharynx: No oropharyngeal exudate.  Eyes:     General: No scleral icterus.       Right eye: No discharge.        Left eye: No discharge.     Conjunctiva/sclera: Conjunctivae normal.     Pupils: Pupils are equal, round, and reactive to light.  Neck:     Thyroid : No thyromegaly.     Vascular: No JVD.     Trachea: No tracheal deviation.  Cardiovascular:     Rate and Rhythm: Normal rate and regular rhythm.     Pulses: Normal pulses.     Heart sounds: Normal heart sounds. No murmur heard.    No friction rub. No gallop.  Pulmonary:     Effort: Pulmonary effort is normal. No respiratory distress.     Breath sounds: Normal breath sounds. No wheezing or rales.  Chest:     Chest wall: No tenderness.  Abdominal:     General: Bowel sounds are normal. There is no distension.     Palpations: Abdomen is soft. There is no mass.     Tenderness: There is no abdominal tenderness. There is no guarding or rebound.   Genitourinary:    Penis: Normal. No tenderness.      Testes: Normal.     Prostate: Normal.     Rectum: Normal. Guaiac result negative.  Musculoskeletal:        General: No tenderness. Normal range of motion.     Cervical back: Neck supple.  Lymphadenopathy:     Cervical: No cervical adenopathy.  Skin:    General: Skin is warm and dry.     Coloration: Skin is not pale.     Findings: No erythema or rash.  Neurological:     General: No focal deficit present.     Mental Status: He is alert and oriented to person, place, and time.     Cranial Nerves: No cranial nerve deficit.     Motor: No abnormal muscle tone.     Coordination: Coordination normal.     Deep Tendon Reflexes: Reflexes are normal and symmetric. Reflexes normal.  Psychiatric:        Mood and Affect: Mood normal.        Behavior: Behavior normal.        Thought Content: Thought content normal.  Judgment: Judgment normal.           Assessment & Plan:  He seems to be doing well overall. We will get labs to check his A1c, lipids, Hgb, iron, etc. We will clear him for the planned knee replacement. I personally spent a total of  34 minutes in the care of the patient today including getting/reviewing separately obtained history, performing a medically appropriate exam/evaluation, placing orders, and referring and communicating with other health care professionals.  Garnette Olmsted, MD

## 2024-04-08 ENCOUNTER — Ambulatory Visit: Payer: Self-pay | Admitting: Family Medicine

## 2024-04-08 ENCOUNTER — Encounter: Payer: Self-pay | Admitting: Family Medicine

## 2024-04-08 ENCOUNTER — Telehealth: Payer: Self-pay | Admitting: *Deleted

## 2024-04-08 NOTE — Telephone Encounter (Signed)
 Received pt surgical clearance form completed and faxed back to [facility]

## 2024-04-08 NOTE — Telephone Encounter (Signed)
 Copied from CRM #[id number]. Topic: Clinical - Lab/Test Results >> Apr 08, 2024  1:00 PM Shardie S wrote: Reason for CRM: Patient returning call to obtain lab results. Reviewed results per provider note, verbatim. Patient verbalized understanding and no additional questions at this time.

## 2024-04-09 NOTE — Telephone Encounter (Signed)
 Any time the PSA goes up, you know that prostate cancer is a concern. However any mild infection or other irritation to the gland can also make this go up. Sometimes when it goes up a little like this, it goes back down to normal the next time we check it. If it does not go down, then I refer the patient to Urology to evaluate it further. There is nothing he can do about it, we will simply give it time.

## 2024-04-09 NOTE — Telephone Encounter (Signed)
 Noted

## 2024-04-29 NOTE — Progress Notes (Signed)
 Sent message, via epic in basket, requesting orders in epic from [facility].

## 2024-04-30 DIAGNOSIS — M1711 Unilateral primary osteoarthritis, right knee: Secondary | ICD-10-CM | POA: Diagnosis not present

## 2024-05-04 DIAGNOSIS — M1711 Unilateral primary osteoarthritis, right knee: Secondary | ICD-10-CM | POA: Diagnosis not present

## 2024-05-07 NOTE — Patient Instructions (Signed)
 SURGICAL WAITING ROOM VISITATION Patients having surgery or a procedure may have no more than 2 support people in the waiting area - these visitors may rotate in the visitor waiting room.   Due to an increase in RSV and influenza rates and associated hospitalizations, children ages 37 and under may not visit patients in [facility] hospitals. If the patient needs to stay at the hospital during part of their recovery, the visitor guidelines for inpatient rooms apply.  PRE-OP VISITATION  Pre-op nurse will coordinate an appropriate time for 1 support person to accompany the patient in pre-op.  This support person may not rotate.  This visitor will be contacted when the time is appropriate for the visitor to come back in the pre-op area.  Please refer to the [facility] website for the visitor guidelines for Inpatients (after your surgery is over and you are in a regular room).  You are not required to quarantine at this time prior to your surgery. However, you must do this: Hand Hygiene often Do NOT share personal items Notify your provider if you are in close contact with someone who has COVID or you develop fever 100.4 or greater, new onset of sneezing, cough, sore throat, shortness of breath or body aches.  If you test positive for Covid or have been in contact with anyone that has tested positive in the last 10 days please notify you surgeon.    Your procedure is scheduled on: 03/18/24   Report to [facility] Main Entrance: [place] entrance where the [employer] is available.   Report to admitting at:5:15 AM  Call this number if you have any questions or problems the morning of surgery [phone]  FOLLOW ANY ADDITIONAL PRE OP INSTRUCTIONS YOU RECEIVED FROM YOUR SURGEON'S OFFICE!!!  Do not eat food after Midnight the night prior to your surgery/procedure.  After Midnight you may have the following liquids until: 4:30 AM DAY OF SURGERY  Clear Liquid Diet Water Black  Coffee (sugar ok, NO MILK/CREAM OR CREAMERS)  Tea (sugar ok, NO MILK/CREAM OR CREAMERS) regular and decaf                             Plain Jell-O  with no fruit (NO RED)                                           Fruit ices (not with fruit pulp, NO RED)                                     Popsicles (NO RED)                                                                  Juice: NO CITRUS JUICES: only apple, WHITE grape, WHITE cranberry Sports drinks like Gatorade or Powerade (NO RED)   The day of surgery:  Drink ONE (1) Pre-Surgery Clear Ensure  at: 4:30 AM the morning of surgery. Drink in one sitting. Do not sip.  This drink was given to  you during your hospital pre-op appointment visit. Nothing else to drink after completing the Pre-Surgery Clear Ensure or G2 : No candy, chewing gum or throat lozenges.    Oral Hygiene is also important to reduce your risk of infection.        Remember - BRUSH YOUR TEETH THE MORNING OF SURGERY WITH YOUR REGULAR TOOTHPASTE  Do NOT smoke after Midnight the night before surgery.  STOP TAKING all Vitamins, Herbs and supplements 1 week before your surgery.   Take ONLY these medicines the morning of surgery with A SIP OF WATER: sertraline ,loratadine.Use inhalers as usual and bring them.                   You may not have any metal on your body including hair pins, jewelry, and body piercing  Do not wear lotions, powders, perfumes / cologne, or deodorant  Men may shave face and neck.  Contacts, Hearing Aids, dentures or bridgework may not be worn into surgery. DENTURES WILL BE REMOVED PRIOR TO SURGERY PLEASE DO NOT APPLY Poly grip OR ADHESIVES!!!  You may bring a small overnight bag with you on the day of surgery, only pack items that are not valuable. [facility] IS NOT RESPONSIBLE   FOR VALUABLES THAT ARE LOST OR STOLEN.   Patients discharged on the day of surgery will not be allowed to drive home.  Someone NEEDS to stay with you for the first 24 hours  after anesthesia.  Do not bring your home medications to the hospital. The Pharmacy will dispense medications listed on your medication list to you during your admission in the Hospital.  Special Instructions: Bring a copy of your healthcare power of attorney and living will documents the day of surgery, if you wish to have them scanned into your [facility] Records- EPIC  Please read over the following fact sheets you were given: IF YOU HAVE QUESTIONS ABOUT YOUR PRE-OP INSTRUCTIONS, PLEASE CALL [phone]  PATIENT SIGNATURE_________________________________  NURSE SIGNATURE__________________________________  ________________________________________________________________________  Pre-operative 4 CHG Bath Instructions  DYNA-Hex 4 Chlorhexidine Gluconate 4% Solution Antiseptic 4 fl. oz   You can play a key role in reducing the risk of infection after surgery. Your skin needs to be as free of germs as possible. You can reduce the number of germs on your skin by washing with CHG (chlorhexidine gluconate) soap before surgery. CHG is an antiseptic soap that kills germs and continues to kill germs even after washing.   DO NOT use if you have an allergy to chlorhexidine/CHG or antibacterial soaps. If your skin becomes reddened or irritated, stop using the CHG and notify one of our RNs at   Please shower with the CHG soap starting 4 days before surgery using the following schedule:     Please keep in mind the following:  DO NOT shave, including legs and underarms, starting the day of your first shower.   You may shave your face at any point before/day of surgery.  Place clean sheets on your bed the day you start using CHG soap. Use a clean washcloth (not used since being washed) for each shower. DO NOT sleep with pets once you start using the CHG.  CHG Shower Instructions:  If you choose to wash your hair and private area, wash first with your normal shampoo/soap.  After you use  shampoo/soap, rinse your hair and body thoroughly to remove shampoo/soap residue.  Turn the water OFF and apply about 3 tablespoons (45 ml) of CHG soap  to a [employer].  Apply CHG soap ONLY FROM YOUR NECK DOWN TO YOUR TOES (washing for 3-5 minutes)  DO NOT use CHG soap on face, private areas, open wounds, or sores.  Pay special attention to the area where your surgery is being performed.  If you are having back surgery, having someone wash your back for you may be helpful. Wait 2 minutes after CHG soap is applied, then you may rinse off the CHG soap.  Pat dry with a clean towel  Put on clean clothes/pajamas   If you choose to wear lotion, please use ONLY the CHG-compatible lotions on the back of this paper.     Additional instructions for the day of surgery: DO NOT APPLY any lotions, deodorants, cologne, or perfumes.   Put on clean/comfortable clothes.  Brush your teeth.  Ask your nurse before applying any prescription medications to the skin.   CHG Compatible Lotions   Aveeno Moisturizing lotion  Cetaphil Moisturizing Cream  Cetaphil Moisturizing Lotion  Clairol Herbal Essence Moisturizing Lotion, Dry Skin  Clairol Herbal Essence Moisturizing Lotion, Extra Dry Skin  Clairol Herbal Essence Moisturizing Lotion, Normal Skin  Curel Age Defying Therapeutic Moisturizing Lotion with Alpha Hydroxy  Curel Extreme Care Body Lotion  Curel Soothing Hands Moisturizing Hand Lotion  Curel Therapeutic Moisturizing Cream, Fragrance-Free  Curel Therapeutic Moisturizing Lotion, Fragrance-Free  Curel Therapeutic Moisturizing Lotion, Original Formula  Eucerin Daily Replenishing Lotion  Eucerin Dry Skin Therapy Plus Alpha Hydroxy Crme  Eucerin Dry Skin Therapy Plus Alpha Hydroxy Lotion  Eucerin Original Crme  Eucerin Original Lotion  Eucerin Plus Crme Eucerin Plus Lotion  Eucerin TriLipid Replenishing Lotion  Keri Anti-Bacterial Hand Lotion  Keri Deep Conditioning Original Lotion Dry Skin  Formula Softly Scented  Keri Deep Conditioning Original Lotion, Fragrance Free Sensitive Skin Formula  Keri Lotion Fast Absorbing Fragrance Free Sensitive Skin Formula  Keri Lotion Fast Absorbing Softly Scented Dry Skin Formula  Keri Original Lotion  Keri Skin Renewal Lotion Keri Silky Smooth Lotion  Keri Silky Smooth Sensitive Skin Lotion  Nivea Body Creamy Conditioning Oil  Nivea Body Extra Enriched Lotion  Nivea Body Original Lotion  Nivea Body Sheer Moisturizing Lotion Nivea Crme  Nivea Skin Firming Lotion  NutraDerm 30 Skin Lotion  NutraDerm Skin Lotion  NutraDerm Therapeutic Skin Cream  NutraDerm Therapeutic Skin Lotion  ProShield Protective Hand Cream  Provon moisturizing lotion  Incentive Spirometer  An incentive spirometer is a tool that can help keep your lungs clear and active. This tool measures how well you are filling your lungs with each breath. Taking long deep breaths may help reverse or decrease the chance of developing breathing (pulmonary) problems (especially infection) following: A long period of time when you are unable to move or be active. BEFORE THE PROCEDURE  If the spirometer includes an indicator to show your best effort, your nurse or respiratory therapist will set it to a desired goal. If possible, sit up straight or lean slightly forward. Try not to slouch. Hold the incentive spirometer in an upright position. INSTRUCTIONS FOR USE  Sit on the edge of your bed if possible, or sit up as far as you can in bed or on a chair. Hold the incentive spirometer in an upright position. Breathe out normally. Place the mouthpiece in your mouth and seal your lips tightly around it. Breathe in slowly and as deeply as possible, raising the piston or the ball toward the top of the column. Hold your breath for 3-5 seconds or for  as long as possible. Allow the piston or ball to fall to the bottom of the column. Remove the mouthpiece from your mouth and breathe out  normally. Rest for a few seconds and repeat Steps 1 through 7 at least 10 times every 1-2 hours when you are awake. Take your time and take a few normal breaths between deep breaths. The spirometer may include an indicator to show your best effort. Use the indicator as a goal to work toward during each repetition. After each set of 10 deep breaths, practice coughing to be sure your lungs are clear. If you have an incision (the cut made at the time of surgery), support your incision when coughing by placing a pillow or rolled up towels firmly against it. Once you are able to get out of bed, walk around indoors and cough well. You may stop using the incentive spirometer when instructed by your caregiver.  RISKS AND COMPLICATIONS Take your time so you do not get dizzy or light-headed. If you are in pain, you may need to take or ask for pain medication before doing incentive spirometry. It is harder to take a deep breath if you are having pain. AFTER USE Rest and breathe slowly and easily. It can be helpful to keep track of a log of your progress. Your caregiver can provide you with a simple table to help with this. If you are using the spirometer at home, follow these instructions: SEEK MEDICAL CARE IF:  You are having difficultly using the spirometer. You have trouble using the spirometer as often as instructed. Your pain medication is not giving enough relief while using the spirometer. You develop fever of 100.5 F (38.1 C) or higher. SEEK IMMEDIATE MEDICAL CARE IF:  You cough up bloody sputum that had not been present before. You develop fever of 102 F (38.9 C) or greater. You develop worsening pain at or near the incision site. MAKE SURE YOU:  Understand these instructions. Will watch your condition. Will get help right away if you are not doing well or get worse. Document Released: 10/07/2006 Document Revised: 08/19/2011 Document Reviewed: 12/08/2006 [facility] Patient Information  2014 [place], [place].   ________________________________________________________________________

## 2024-05-10 ENCOUNTER — Encounter (HOSPITAL_COMMUNITY): Payer: Self-pay

## 2024-05-10 ENCOUNTER — Other Ambulatory Visit: Payer: Self-pay

## 2024-05-10 ENCOUNTER — Encounter (HOSPITAL_COMMUNITY)
Admission: RE | Admit: 2024-05-10 | Discharge: 2024-05-10 | Disposition: A | Source: Ambulatory Visit | Attending: Orthopedic Surgery

## 2024-05-10 VITALS — BP 120/80 | HR 75 | Temp 98.6°F | Ht 70.0 in | Wt 210.0 lb

## 2024-05-10 DIAGNOSIS — Z01818 Encounter for other preprocedural examination: Secondary | ICD-10-CM | POA: Diagnosis not present

## 2024-05-10 HISTORY — DX: Pneumonia, unspecified organism: J18.9

## 2024-05-10 HISTORY — DX: Depression, unspecified: F32.A

## 2024-05-10 LAB — SURGICAL PCR SCREEN
MRSA, PCR: NEGATIVE
Staphylococcus aureus: NEGATIVE

## 2024-05-10 LAB — CBC
HCT: 52.4 % — ABNORMAL HIGH (ref 39.0–52.0)
Hemoglobin: 17 g/dL (ref 13.0–17.0)
MCH: 27.4 pg (ref 26.0–34.0)
MCHC: 32.4 g/dL (ref 30.0–36.0)
MCV: 84.4 fL (ref 80.0–100.0)
Platelets: 297 K/uL (ref 150–400)
RBC: 6.21 MIL/uL — ABNORMAL HIGH (ref 4.22–5.81)
RDW: 13.8 % (ref 11.5–15.5)
WBC: 8.8 K/uL (ref 4.0–10.5)
nRBC: 0 % (ref 0.0–0.2)

## 2024-05-10 NOTE — Progress Notes (Signed)
 Lab. Results : HCCT: 52.4

## 2024-05-10 NOTE — Progress Notes (Signed)
 For Anesthesia: PCP - Johnny Garnette LABOR, MD  Cardiologist - N/A  Bowel Prep reminder:N/A  Chest x-ray -  EKG - 05/10/24 Stress Test -  ECHO -  Cardiac Cath -  Pacemaker/ICD device last checked: Pacemaker orders received: Device Rep notified:  Spinal Cord Stimulator:N/A  Sleep Study -N/A  CPAP -   Fasting Blood Sugar - N/A Checks Blood Sugar _____ times a day Date and result of last Hgb A1c-  Last dose of GLP1 agonist- N/A GLP1 instructions: Hold 7 days prior to schedule (Hold 24 hours-daily)   Last dose of SGLT-2 inhibitors- N/A SGLT-2 instructions: Hold 72 hours prior to surgery  Blood Thinner Instructions:N/A Last Dose: Time last taken:  Aspirin  Instructions:N/A Last Dose: Time last taken:  Activity level: Can go up a flight of stairs and activities of daily living without stopping and without chest pain and/or shortness of breath   Able to exercise without chest pain and/or shortness of breath  Anesthesia review:   Patient denies shortness of breath, fever, cough and chest pain at PAT appointment   Patient verbalized understanding of instructions that were reviewed over the telephone.

## 2024-05-13 NOTE — Care Plan (Signed)
 Ortho Bundle Case Management Note  Patient Details  Name: Micheal Hamilton MRN: [id number] Date of Birth: [birth year]  met with patient and wife in the office for H&P. will discharge to home with family to assist. rolling walker ordered for home use. OPPT set up with [facility] discharge instructions discussed and questions answered.  Patient and MD in agreement. Choice offered.                     DME Arranged:  Vannie rolling DME Agency:  [employer]  HH Arranged:    HH Agency:     Additional Comments: Please contact me with any questions of if this plan should need to change.  Charlies Pitch,  RN,BSN,MHA,CCM  [facility] Orthopaedic Specialist  [phone] 05/13/2024, 11:53 AM

## 2024-05-17 NOTE — H&P (Signed)
 PREOPERATIVE H&P  Chief Complaint: Right knee pain  HPI: Micheal Hamilton is a 73 y.o. male here regarding right knee pain. Right knee pain has really been ongoing for the last year. He rates it to be a moderate to severe with activity. He has tried NSAIDs, corticosteroid injections, activity modification, and bracing without significant relief of his symptoms. He does appear pain with twisting. He feels swollen and tight. He gets specific pain when getting up from a sitting to standing position pain can be both medial and lateral though is usually worse medially. He has no previous surgeries on the right knee. He is here today to talk about definitive surgical treatment.  Past Medical History:  Diagnosis Date   Allergy    occ seasonally    Arthritis    Colon polyps    hx of   Depression    Diverticulosis    GERD (gastroesophageal reflux disease)    Hyperlipidemia    Iron deficiency anemia    presumed from hemorrhoids which bleed daily (GI consult 2014)   Obesity    Pneumonia    Pre-diabetes    no medicines    Past Surgical History:  Procedure Laterality Date   BAND HEMORRHOIDECTOMY     COLONOSCOPY  09/17/2019   per Dr. Teressa, no polyps, repeat in 10  yrs   edg  2004   Dr Dyane- hiatal hernia   POLYPECTOMY     TONSILLECTOMY     UPPER GASTROINTESTINAL ENDOSCOPY     Social History   Socioeconomic History   Marital status: Married    Spouse name: Not on file   Number of children: 3   Years of education: Not on file   Highest education level: Bachelor's degree (e.g., BA, AB, BS)  Occupational History   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, Museum/gallery  Tobacco Use   Smoking status: Former    Current packs/day: 0.00    Average packs/day: 2.5 packs/day for 25.0 years (62.4 ttl pk-yrs)    Types: Cigarettes    Start date: 03/10/1964    Quit date: 06/10/1988    Years since quitting: 35.9   Smokeless tobacco: Never   Tobacco comments:    quit 25 years ago   Vaping Use   Vaping status: Some Days   Devices: canabis  Substance and Sexual Activity   Alcohol use: Yes    Comment: social.   Drug use: Yes    Comment: canabis   Sexual activity: Not on file  Other Topics Concern   Not on file  Social History Narrative   Regular exercise- No   Social Drivers of Health   Financial Resource Strain: Low Risk  (07/07/2023)   Overall Financial Resource Strain (CARDIA)    Difficulty of Paying Living Expenses: Not very hard  Food Insecurity: No Food Insecurity (07/07/2023)   Hunger Vital Sign    Worried About Running Out of Food in the Last Year: Never true    Ran Out of Food in the Last Year: Never true  Transportation Needs: No Transportation Needs (07/07/2023)   PRAPARE - Administrator, Civil Service (Medical): No    Lack of Transportation (Non-Medical): No  Physical Activity: Inactive (07/07/2023)   Exercise Vital Sign    Days of Exercise per Week: 0 days    Minutes of Exercise per Session: 0 min  Stress: Stress Concern Present (07/07/2023)   [employer] of Occupational Health - Occupational Stress Questionnaire  Feeling of Stress : To some extent  Social Connections: Moderately Integrated (07/07/2023)   Social Connection and Isolation Panel    Frequency of Communication with Friends and Family: More than three times a week    Frequency of Social Gatherings with Friends and Family: Once a week    Attends Religious Services: 1 to 4 times per year    Active Member of [employer] or Organizations: No    Attends Engineer, Structural: Not on file    Marital Status: Married   Family History  Problem Relation Age of Onset   Lung cancer Father    Cancer Father        lung   Crohn's disease Sister    Diabetes Cousin    Heart disease Maternal Uncle    Colon cancer Neg Hx    Colon polyps Neg Hx    Esophageal cancer Neg Hx    Stomach cancer Neg Hx    Rectal cancer Neg Hx    Allergies  Allergen Reactions   Crestor  [Rosuvastatin]     REACTION: Myositis   Prior to Admission medications   Medication Sig Start Date End Date Taking? Authorizing Provider  albuterol  (VENTOLIN  HFA) 108 (90 Base) MCG/ACT inhaler Inhale 2 puffs into the lungs every 6 (six) hours as needed for wheezing or shortness of breath. 07/07/23  Yes Jordan, Betty G, MD  ezetimibe  (ZETIA ) 10 MG tablet TAKE 1 TABLET BY MOUTH EVERY DAY 12/22/23  Yes Johnny Garnette LABOR, MD  ferrous sulfate  325 (65 FE) MG tablet TAKE 1 TABLET BY MOUTH 3 TIMES DAILY WITH MEALS. Patient taking differently: Take 325 mg by mouth 2 (two) times daily with a meal. 09/30/23  Yes Johnny Garnette LABOR, MD  loratadine (CLARITIN) 10 MG tablet Take 10 mg by mouth daily as needed for allergies.   Yes [provider]  meloxicam (MOBIC) 15 MG tablet Take 15 mg by mouth daily.   Yes [provider]  sertraline  (ZOLOFT ) 100 MG tablet TAKE 1 TABLET BY MOUTH EVERY DAY 03/19/24  Yes Johnny Garnette LABOR, MD  sertraline  (ZOLOFT ) 50 MG tablet Take 1 tablet (50 mg total) by mouth daily. 06/30/23  Yes Johnny Garnette LABOR, MD  vitamin C  (ASCORBIC ACID) 250 MG tablet Take 1 tablet (250 mg total) by mouth 3 (three) times daily. 10/30/16  Yes Johnny Garnette LABOR, MD  Vitamin D , Ergocalciferol , (DRISDOL ) 1.25 MG (50000 UNIT) CAPS capsule TAKE 1 CAPSULE BY MOUTH ONE TIME PER WEEK 09/15/23  Yes Johnny Garnette LABOR, MD  zinc gluconate 50 MG tablet Take 50 mg by mouth daily.   Yes [provider]  diclofenac  (VOLTAREN ) 75 MG EC tablet TAKE 1 TABLET BY MOUTH TWICE A DAY Patient not taking: Reported on 05/03/2024 12/22/23   Johnny Garnette LABOR, MD  erythromycin  ophthalmic ointment Place 1 Application into the right eye 4 (four) times daily. Patient not taking: Reported on 05/03/2024 11/20/23   Johnny Garnette LABOR, MD  meclizine  (ANTIVERT ) 25 MG tablet Take 1 tablet (25 mg total) by mouth every 4 (four) hours as needed for dizziness. Patient not taking: Reported on 05/03/2024 03/04/22   Johnny Garnette LABOR, MD  mometasone   (ELOCON ) 0.1 % cream Apply 1 Application topically 2 (two) times daily as needed. Patient not taking: Reported on 05/03/2024 03/04/22   Johnny Garnette LABOR, MD  promethazine -dextromethorphan (PROMETHAZINE -DM) 6.25-15 MG/5ML syrup Take 5 mLs by mouth 4 (four) times daily as needed. Patient not taking: Reported on 05/03/2024 07/15/23   Billy,  Philippe SAUNDERS, NP     Positive ROS: All other systems have been reviewed and were otherwise negative with the exception of those mentioned in the HPI and as above.  Physical Exam: Height 5' 9 weight: 210 pounds General: Alert, no acute distress Cardiovascular: No pedal edema Respiratory: No cyanosis, no use of accessory musculature GI: No organomegaly, abdomen is soft and non-tender Skin: No lesions in the area of chief complaint Neurologic: Sensation intact distally Psychiatric: Patient is competent for consent with normal mood and affect Lymphatic: No axillary or cervical lymphadenopathy  MUSCULOSKELETAL: 0 to 115 degrees of range of motion of the right knee. Straight leg raise is intact. Full range of motion of the right ankle. No decrease sensation of the right foot on exam today. 2+ PT pulse. Positive medial joint line tenderness.  X-rays: 4 views of right knee taken 04/30/24 show severe right knee osteoarthritis worse in the medial compartment MRI performed 09/30/23 shows medial degenerative changes with extensive subchondral edema. Complex tear of the posterior horn of the medial meniscus.  Assessment: Right knee osteoarthritis   Plan: Plan for Procedure(s): ARTHROPLASTY, KNEE, TOTAL  The risks benefits and alternatives were discussed with the patient including but not limited to the risks of nonoperative treatment, versus surgical intervention including infection, bleeding, nerve injury,  blood clots, cardiopulmonary complications, morbidity, mortality, among others, and they were willing to proceed.   Please see Case Management note regarding  postoperative PT and DME plans.   Army MARLA Daring, PA-C   05/17/2024 1:08 PM

## 2024-05-17 NOTE — Discharge Instructions (Signed)
 INSTRUCTIONS AFTER JOINT REPLACEMENT   Remove items at home which could result in a fall. This includes throw rugs or furniture in walking pathways ICE to the affected joint every three hours while awake for 30 minutes at a time, for at least the first 3-5 days, and then as needed for pain and swelling.  Continue to use ice for pain and swelling. You may notice swelling that will progress down to the foot and ankle.  This is normal after surgery.  Elevate your leg when you are not up walking on it.   Continue to use the breathing machine you got in the hospital (incentive spirometer) which will help keep your temperature down.  It is common for your temperature to cycle up and down following surgery, especially at night when you are not up moving around and exerting yourself.  The breathing machine keeps your lungs expanded and your temperature down.   DIET:  As you were doing prior to hospitalization, we recommend a well-balanced diet.  DRESSING / WOUND CARE / SHOWERING  You may shower 3 days after surgery, but keep the wounds dry during showering.  You may use an occlusive plastic wrap (Press'n Seal for example), NO SOAKING/SUBMERGING IN THE BATHTUB.  If the bandage gets wet, change with a clean dry gauze.  If the incision gets wet, pat the wound dry with a clean towel. You may remove the ace wrap around your leg 24 hours after surgery. Once you remove this please wear your compression stockings on both legs during the day, you may remove them at night when sleeping.   ACTIVITY  Increase activity slowly as tolerated, but follow the weight bearing instructions below.   No driving for 6 weeks or until further direction given by your physician.  You cannot drive while taking narcotics.  No lifting or carrying greater than 10 lbs. until further directed by your surgeon. Avoid periods of inactivity such as sitting longer than an hour when not asleep. This helps prevent blood clots.  You may return to  work once you are authorized by your doctor.     WEIGHT BEARING   Weight bearing as tolerated with assist device (walker, cane, etc) as directed, use it as long as suggested by your surgeon or therapist, typically at least 4-6 weeks.   EXERCISES  Results after joint replacement surgery are often greatly improved when you follow the exercise, range of motion and muscle strengthening exercises prescribed by your doctor. Safety measures are also important to protect the joint from further injury. Any time any of these exercises cause you to have increased pain or swelling, decrease what you are doing until you are comfortable again and then slowly increase them. If you have problems or questions, call your caregiver or physical therapist for advice.   Rehabilitation is important following a joint replacement. After just a few days of immobilization, the muscles of the leg can become weakened and shrink (atrophy).  These exercises are designed to build up the tone and strength of the thigh and leg muscles and to improve motion. Often times heat used for twenty to thirty minutes before working out will loosen up your tissues and help with improving the range of motion but do not use heat for the first two weeks following surgery (sometimes heat can increase post-operative swelling).   These exercises can be done on a training (exercise) mat, on the floor, on a table or on a bed. Use whatever works the best and  is most comfortable for you.    Use music or television while you are exercising so that the exercises are a pleasant break in your day. This will make your life better with the exercises acting as a break in your routine that you can look forward to.   Perform all exercises about fifteen times, three times per day or as directed.  You should exercise both the operative leg and the other leg as well.  Exercises include:   Quad Sets - Tighten up the muscle on the front of the thigh (Quad) and hold  for 5-10 seconds.   Straight Leg Raises - With your knee straight (if you were given a brace, keep it on), lift the leg to 60 degrees, hold for 3 seconds, and slowly lower the leg.  Perform this exercise against resistance later as your leg gets stronger.  Leg Slides: Lying on your back, slowly slide your foot toward your buttocks, bending your knee up off the floor (only go as far as is comfortable). Then slowly slide your foot back down until your leg is flat on the floor again.  Angel Wings: Lying on your back spread your legs to the side as far apart as you can without causing discomfort.  Hamstring Strength:  Lying on your back, push your heel against the floor with your leg straight by tightening up the muscles of your buttocks.  Repeat, but this time bend your knee to a comfortable angle, and push your heel against the floor.  You may put a pillow under the heel to make it more comfortable if necessary.   A rehabilitation program following joint replacement surgery can speed recovery and prevent re-injury in the future due to weakened muscles. Contact your doctor or a physical therapist for more information on knee rehabilitation.    CONSTIPATION  Constipation is defined medically as fewer than three stools per week and severe constipation as less than one stool per week.  Even if you have a regular bowel pattern at home, your normal regimen is likely to be disrupted due to multiple reasons following surgery.  Combination of anesthesia, postoperative narcotics, change in appetite and fluid intake all can affect your bowels.   YOU MUST use at least one of the following options; they are listed in order of increasing strength to get the job done.  They are all available over the counter, and you may need to use some, POSSIBLY even all of these options:    Drink plenty of fluids (prune juice may be helpful) and high fiber foods Colace 100 mg by mouth twice a day  Senokot for constipation as  directed and as needed Dulcolax (bisacodyl), take with full glass of water   Miralax (polyethylene glycol) once or twice a day as needed.  If you have tried all these things and are unable to have a bowel movement in the first 3-4 days after surgery call either your surgeon or your primary doctor.    If you experience loose stools or diarrhea, hold the medications until you stool forms back up.  If your symptoms do not get better within 1 week or if they get worse, check with your doctor.  If you experience the worst abdominal pain ever or develop nausea or vomiting, please contact the office immediately for further recommendations for treatment.   ITCHING:  If you experience itching with your medications, try taking only a single pain pill, or even half a pain pill at a time.  You can also use Benadryl over the counter for itching or also to help with sleep.   TED HOSE STOCKINGS:  Use stockings on both legs until for at least 2 weeks or as directed by physician office. They may be removed at night for sleeping.  MEDICATIONS:  See your medication summary on the "After Visit Summary" that nursing will review with you.  You may have some home medications which will be placed on hold until you complete the course of blood thinner medication.  It is important for you to complete the blood thinner medication as prescribed.  PRECAUTIONS:  If you experience chest pain or shortness of breath - call 911 immediately for transfer to the hospital emergency department.   If you develop a fever greater that 101 F, purulent drainage from wound, increased redness or drainage from wound, foul odor from the wound/dressing, or calf pain - CONTACT YOUR SURGEON.                                                   FOLLOW-UP APPOINTMENTS:  If you do not already have a post-op appointment, please call the office for an appointment to be seen by your surgeon.  Guidelines for how soon to be seen are listed in your "After  Visit Summary", but are typically between 1-4 weeks after surgery.  OTHER INSTRUCTIONS:   Knee Replacement:  Do not place pillow under knee, focus on keeping the knee straight while resting.   POST-OPERATIVE OPIOID TAPER INSTRUCTIONS: It is important to wean off of your opioid medication as soon as possible. If you do not need pain medication after your surgery it is ok to stop day one. Opioids include: Codeine, Hydrocodone(Norco, Vicodin), Oxycodone (Percocet, oxycontin ) and hydromorphone  amongst others.  Long term and even short term use of opiods can cause: Increased pain response Dependence Constipation Depression Respiratory depression And more.  Withdrawal symptoms can include Flu like symptoms Nausea, vomiting And more Techniques to manage these symptoms Hydrate well Eat regular healthy meals Stay active Use relaxation techniques(deep breathing, meditating, yoga) Do Not substitute Alcohol to help with tapering If you have been on opioids for less than two weeks and do not have pain than it is ok to stop all together.  Plan to wean off of opioids This plan should start within one week post op of your joint replacement. Maintain the same interval or time between taking each dose and first decrease the dose.  Cut the total daily intake of opioids by one tablet each day Next start to increase the time between doses. The last dose that should be eliminated is the evening dose.   MAKE SURE YOU:  Understand these instructions.  Get help right away if you are not doing well or get worse.    Thank you for letting us  be a part of your medical care team.  It is a privilege we respect greatly.  We hope these instructions will help you stay on track for a fast and full recovery!

## 2024-05-18 ENCOUNTER — Ambulatory Visit (HOSPITAL_COMMUNITY)

## 2024-05-18 ENCOUNTER — Encounter (HOSPITAL_COMMUNITY): Payer: Self-pay | Admitting: Orthopedic Surgery

## 2024-05-18 ENCOUNTER — Encounter (HOSPITAL_COMMUNITY): Admission: RE | Disposition: A | Payer: Self-pay | Source: Ambulatory Visit | Attending: Orthopedic Surgery

## 2024-05-18 ENCOUNTER — Ambulatory Visit (HOSPITAL_COMMUNITY): Payer: Self-pay | Admitting: Medical

## 2024-05-18 ENCOUNTER — Ambulatory Visit (HOSPITAL_COMMUNITY)
Admission: RE | Admit: 2024-05-18 | Discharge: 2024-05-18 | Disposition: A | Source: Ambulatory Visit | Attending: Orthopedic Surgery | Admitting: Orthopedic Surgery

## 2024-05-18 ENCOUNTER — Ambulatory Visit (HOSPITAL_COMMUNITY): Payer: Self-pay | Admitting: Certified Registered Nurse Anesthetist

## 2024-05-18 ENCOUNTER — Other Ambulatory Visit: Payer: Self-pay

## 2024-05-18 DIAGNOSIS — Z96651 Presence of right artificial knee joint: Secondary | ICD-10-CM | POA: Diagnosis not present

## 2024-05-18 DIAGNOSIS — F418 Other specified anxiety disorders: Secondary | ICD-10-CM | POA: Diagnosis not present

## 2024-05-18 DIAGNOSIS — Z471 Aftercare following joint replacement surgery: Secondary | ICD-10-CM | POA: Diagnosis not present

## 2024-05-18 DIAGNOSIS — E785 Hyperlipidemia, unspecified: Secondary | ICD-10-CM | POA: Diagnosis not present

## 2024-05-18 DIAGNOSIS — R609 Edema, unspecified: Secondary | ICD-10-CM | POA: Diagnosis not present

## 2024-05-18 DIAGNOSIS — G8918 Other acute postprocedural pain: Secondary | ICD-10-CM | POA: Diagnosis not present

## 2024-05-18 DIAGNOSIS — M1711 Unilateral primary osteoarthritis, right knee: Secondary | ICD-10-CM | POA: Diagnosis not present

## 2024-05-18 HISTORY — PX: TOTAL KNEE ARTHROPLASTY: SHX125

## 2024-05-18 SURGERY — ARTHROPLASTY, KNEE, TOTAL
Anesthesia: Spinal | Site: Knee | Laterality: Right

## 2024-05-18 MED ORDER — GLYCOPYRROLATE 0.2 MG/ML IJ SOLN
INTRAMUSCULAR | Status: DC | PRN
  Administered 2024-05-18: .2 mg via INTRAVENOUS

## 2024-05-18 MED ORDER — GLYCOPYRROLATE 0.2 MG/ML IJ SOLN
INTRAMUSCULAR | Status: AC
  Filled 2024-05-18: qty 1

## 2024-05-18 MED ORDER — METOCLOPRAMIDE HCL 5 MG PO TABS: 5.0000 mg | ORAL_TABLET | Freq: Three times a day (TID) | ORAL | Status: DC | PRN

## 2024-05-18 MED ORDER — FENTANYL CITRATE (PF) 100 MCG/2ML IJ SOLN
INTRAMUSCULAR | Status: DC | PRN
  Administered 2024-05-18 (×2): 50 ug via INTRAVENOUS

## 2024-05-18 MED ORDER — PHENYLEPHRINE HCL-NACL 20-0.9 MG/250ML-% IV SOLN
INTRAVENOUS | Status: DC | PRN
  Administered 2024-05-18: 20 ug/min via INTRAVENOUS

## 2024-05-18 MED ORDER — METHOCARBAMOL 500 MG PO TABS
500.0000 mg | ORAL_TABLET | Freq: Four times a day (QID) | ORAL | Status: DC | PRN
  Administered 2024-05-18: 500 mg via ORAL

## 2024-05-18 MED ORDER — TRANEXAMIC ACID-NACL 1000-0.7 MG/100ML-% IV SOLN
1000.0000 mg | INTRAVENOUS | Status: AC
  Administered 2024-05-18: 1000 mg via INTRAVENOUS
  Filled 2024-05-18: qty 100

## 2024-05-18 MED ORDER — CEFAZOLIN SODIUM-DEXTROSE 2-4 GM/100ML-% IV SOLN
2.0000 g | Freq: Four times a day (QID) | INTRAVENOUS | Status: DC
  Administered 2024-05-18: 2 g via INTRAVENOUS

## 2024-05-18 MED ORDER — DEXAMETHASONE SOD PHOSPHATE PF 10 MG/ML IJ SOLN
INTRAMUSCULAR | Status: DC | PRN
  Administered 2024-05-18: 5 mg via INTRAVENOUS

## 2024-05-18 MED ORDER — ASPIRIN 325 MG PO TBEC: 325.0000 mg | DELAYED_RELEASE_TABLET | Freq: Two times a day (BID) | ORAL | 0 refills | Status: AC

## 2024-05-18 MED ORDER — TRANEXAMIC ACID-NACL 1000-0.7 MG/100ML-% IV SOLN
1000.0000 mg | Freq: Once | INTRAVENOUS | Status: AC
  Administered 2024-05-18: 1000 mg via INTRAVENOUS

## 2024-05-18 MED ORDER — ACETAMINOPHEN 500 MG PO TABS: 1000.0000 mg | ORAL_TABLET | Freq: Four times a day (QID) | ORAL | Status: DC

## 2024-05-18 MED ORDER — HYDROMORPHONE HCL 1 MG/ML IJ SOLN: 0.5000 mg | INTRAMUSCULAR | Status: DC | PRN

## 2024-05-18 MED ORDER — ONDANSETRON HCL 4 MG/2ML IJ SOLN
INTRAMUSCULAR | Status: DC | PRN
  Administered 2024-05-18: 4 mg via INTRAVENOUS

## 2024-05-18 MED ORDER — POVIDONE-IODINE 10 % EX SWAB: 2.0000 | Freq: Once | CUTANEOUS | Status: DC

## 2024-05-18 MED ORDER — LACTATED RINGERS IV SOLN: INTRAVENOUS | Status: DC | PRN

## 2024-05-18 MED ORDER — PROPOFOL 10 MG/ML IV BOLUS
INTRAVENOUS | Status: AC
  Filled 2024-05-18: qty 20

## 2024-05-18 MED ORDER — BUPIVACAINE IN DEXTROSE 0.75-8.25 % IT SOLN
INTRATHECAL | Status: DC | PRN
  Administered 2024-05-18: 1.8 mL via INTRATHECAL

## 2024-05-18 MED ORDER — CHLORHEXIDINE GLUCONATE 0.12 % MT SOLN
15.0000 mL | Freq: Once | OROMUCOSAL | Status: AC
  Administered 2024-05-18: 15 mL via OROMUCOSAL

## 2024-05-18 MED ORDER — METOCLOPRAMIDE HCL 5 MG/ML IJ SOLN: 5.0000 mg | Freq: Three times a day (TID) | INTRAMUSCULAR | Status: DC | PRN

## 2024-05-18 MED ORDER — ACETAMINOPHEN 325 MG PO TABS: 325.0000 mg | ORAL_TABLET | Freq: Four times a day (QID) | ORAL | Status: DC | PRN

## 2024-05-18 MED ORDER — KETOROLAC TROMETHAMINE 30 MG/ML IJ SOLN
INTRAMUSCULAR | Status: AC
  Filled 2024-05-18: qty 1

## 2024-05-18 MED ORDER — ONDANSETRON HCL 4 MG PO TABS: 4.0000 mg | ORAL_TABLET | Freq: Three times a day (TID) | ORAL | 0 refills | Status: AC | PRN

## 2024-05-18 MED ORDER — LACTATED RINGERS IV BOLUS: 250.0000 mL | Freq: Once | INTRAVENOUS | Status: DC

## 2024-05-18 MED ORDER — WATER FOR IRRIGATION, STERILE IR SOLN
Status: DC | PRN
  Administered 2024-05-18: 2000 mL

## 2024-05-18 MED ORDER — METHOCARBAMOL 1000 MG/10ML IJ SOLN: 500.0000 mg | Freq: Four times a day (QID) | INTRAMUSCULAR | Status: DC | PRN

## 2024-05-18 MED ORDER — PROPOFOL 1000 MG/100ML IV EMUL
INTRAVENOUS | Status: AC
  Filled 2024-05-18: qty 100

## 2024-05-18 MED ORDER — KETOROLAC TROMETHAMINE 30 MG/ML IJ SOLN
INTRAMUSCULAR | Status: DC | PRN
  Administered 2024-05-18: 31 mL

## 2024-05-18 MED ORDER — ORAL CARE MOUTH RINSE: 15.0000 mL | Freq: Once | OROMUCOSAL | Status: AC

## 2024-05-18 MED ORDER — TRANEXAMIC ACID-NACL 1000-0.7 MG/100ML-% IV SOLN
INTRAVENOUS | Status: AC
  Filled 2024-05-18: qty 100

## 2024-05-18 MED ORDER — ONDANSETRON HCL 4 MG PO TABS: 4.0000 mg | ORAL_TABLET | Freq: Four times a day (QID) | ORAL | Status: DC | PRN

## 2024-05-18 MED ORDER — OXYCODONE HCL 5 MG PO TABS: 5.0000 mg | ORAL_TABLET | ORAL | 0 refills | Status: AC | PRN

## 2024-05-18 MED ORDER — 0.9 % SODIUM CHLORIDE (POUR BTL) OPTIME
TOPICAL | Status: DC | PRN
  Administered 2024-05-18: 1000 mL

## 2024-05-18 MED ORDER — OXYCODONE HCL 5 MG PO TABS
ORAL_TABLET | ORAL | Status: AC
  Filled 2024-05-18: qty 2

## 2024-05-18 MED ORDER — CEFAZOLIN SODIUM-DEXTROSE 2-4 GM/100ML-% IV SOLN
INTRAVENOUS | Status: AC
  Filled 2024-05-18: qty 100

## 2024-05-18 MED ORDER — FENTANYL CITRATE (PF) 100 MCG/2ML IJ SOLN
INTRAMUSCULAR | Status: AC
  Filled 2024-05-18: qty 2

## 2024-05-18 MED ORDER — POVIDONE-IODINE 7.5 % EX SOLN: Freq: Once | CUTANEOUS | Status: DC

## 2024-05-18 MED ORDER — FENTANYL CITRATE (PF) 50 MCG/ML IJ SOSY
50.0000 ug | PREFILLED_SYRINGE | Freq: Once | INTRAMUSCULAR | Status: AC
  Administered 2024-05-18: 100 ug via INTRAVENOUS
  Filled 2024-05-18: qty 2

## 2024-05-18 MED ORDER — OXYCODONE HCL 5 MG PO TABS
10.0000 mg | ORAL_TABLET | ORAL | Status: DC | PRN
  Administered 2024-05-18: 10 mg via ORAL

## 2024-05-18 MED ORDER — ROPIVACAINE HCL 5 MG/ML IJ SOLN
INTRAMUSCULAR | Status: DC | PRN
  Administered 2024-05-18: 20 mL via PERINEURAL

## 2024-05-18 MED ORDER — ONDANSETRON HCL 4 MG/2ML IJ SOLN: 4.0000 mg | Freq: Four times a day (QID) | INTRAMUSCULAR | Status: DC | PRN

## 2024-05-18 MED ORDER — LACTATED RINGERS IV SOLN: INTRAVENOUS | Status: DC

## 2024-05-18 MED ORDER — LACTATED RINGERS IV BOLUS: 500.0000 mL | Freq: Once | INTRAVENOUS | Status: DC

## 2024-05-18 MED ORDER — OXYCODONE HCL 5 MG PO TABS: 5.0000 mg | ORAL_TABLET | ORAL | Status: DC | PRN

## 2024-05-18 MED ORDER — PROPOFOL 10 MG/ML IV BOLUS
INTRAVENOUS | Status: DC | PRN
  Administered 2024-05-18: 20 mg via INTRAVENOUS
  Administered 2024-05-18: 10 mg via INTRAVENOUS
  Administered 2024-05-18 (×4): 20 mg via INTRAVENOUS

## 2024-05-18 MED ORDER — SODIUM CHLORIDE 0.9 % IR SOLN
Status: DC | PRN
  Administered 2024-05-18: 1000 mL

## 2024-05-18 MED ORDER — SENNA-DOCUSATE SODIUM 8.6-50 MG PO TABS: 2.0000 | ORAL_TABLET | Freq: Every day | ORAL | 1 refills | Status: AC

## 2024-05-18 MED ORDER — ACETAMINOPHEN 500 MG PO TABS
1000.0000 mg | ORAL_TABLET | Freq: Once | ORAL | Status: AC
  Administered 2024-05-18: 1000 mg via ORAL
  Filled 2024-05-18: qty 2

## 2024-05-18 MED ORDER — CEFAZOLIN SODIUM-DEXTROSE 2-4 GM/100ML-% IV SOLN
2.0000 g | INTRAVENOUS | Status: AC
  Administered 2024-05-18: 2 g via INTRAVENOUS
  Filled 2024-05-18: qty 100

## 2024-05-18 MED ORDER — PROPOFOL 500 MG/50ML IV EMUL
INTRAVENOUS | Status: DC | PRN
  Administered 2024-05-18: 100 ug/kg/min via INTRAVENOUS

## 2024-05-18 MED ORDER — METHOCARBAMOL 500 MG PO TABS
ORAL_TABLET | ORAL | Status: AC
  Filled 2024-05-18: qty 1

## 2024-05-18 MED ORDER — MIDAZOLAM HCL (PF) 2 MG/2ML IJ SOLN
1.0000 mg | Freq: Once | INTRAMUSCULAR | Status: AC
  Administered 2024-05-18: 1 mg via INTRAVENOUS
  Filled 2024-05-18: qty 2

## 2024-05-18 MED ORDER — METHOCARBAMOL 500 MG PO TABS: 500.0000 mg | ORAL_TABLET | Freq: Three times a day (TID) | ORAL | 0 refills | Status: AC | PRN

## 2024-05-18 MED ORDER — BUPIVACAINE HCL (PF) 0.25 % IJ SOLN
INTRAMUSCULAR | Status: AC
  Filled 2024-05-18: qty 30

## 2024-05-18 SURGICAL SUPPLY — 52 items
ATTUNE MED DOME PAT 38 KNEE (Knees) IMPLANT
ATTUNE PS FEM RT SZ 7 CEM KNEE (Femur) IMPLANT
BAG COUNTER SPONGE SURGICOUNT (BAG) IMPLANT
BAG ZIPLOCK 12X15 (MISCELLANEOUS) IMPLANT
BASEPLATE TIB CEM ATTUNE SZ7 (Knees) IMPLANT
BLADE SAG 18X100X1.27 (BLADE) ×1 IMPLANT
BLADE SAW SGTL 11.0X1.19X90.0M (BLADE) IMPLANT
BLADE SAW SGTL 13X75X1.27 (BLADE) ×1 IMPLANT
BLADE SURG 15 STRL LF DISP TIS (BLADE) ×1 IMPLANT
BNDG ELASTIC 6X10 VLCR STRL LF (GAUZE/BANDAGES/DRESSINGS) ×1 IMPLANT
BOWL SMART MIX CTS (DISPOSABLE) ×1 IMPLANT
CEMENT HV SMART SET (Cement) ×2 IMPLANT
CLSR STERI-STRIP ANTIMIC 1/2X4 (GAUZE/BANDAGES/DRESSINGS) ×2 IMPLANT
COVER SURGICAL LIGHT HANDLE (MISCELLANEOUS) ×1 IMPLANT
CUFF TRNQT CYL 34X4.125X (TOURNIQUET CUFF) ×1 IMPLANT
DERMABOND ADVANCED .7 DNX12 (GAUZE/BANDAGES/DRESSINGS) IMPLANT
DRAPE SHEET LG 3/4 BI-LAMINATE (DRAPES) ×1 IMPLANT
DRAPE U-SHAPE 47X51 STRL (DRAPES) ×1 IMPLANT
DRSG MEPILEX POST OP 4X12 (GAUZE/BANDAGES/DRESSINGS) ×1 IMPLANT
DURAPREP 26ML APPLICATOR (WOUND CARE) ×2 IMPLANT
ELECT REM PT RETURN 15FT ADLT (MISCELLANEOUS) ×1 IMPLANT
GAUZE PAD ABD 8X10 STRL (GAUZE/BANDAGES/DRESSINGS) ×2 IMPLANT
GLOVE BIO SURGEON STRL SZ 6.5 (GLOVE) ×1 IMPLANT
GLOVE BIO SURGEON STRL SZ7.5 (GLOVE) ×1 IMPLANT
GLOVE BIOGEL PI IND STRL 7.0 (GLOVE) ×1 IMPLANT
GLOVE BIOGEL PI IND STRL 8 (GLOVE) ×1 IMPLANT
GOWN STRL SURGICAL XL XLNG (GOWN DISPOSABLE) ×2 IMPLANT
HOLDER FOLEY CATH W/STRAP (MISCELLANEOUS) ×1 IMPLANT
IMMOBILIZER KNEE 20 THIGH 36 (SOFTGOODS) ×1 IMPLANT
INSERT TIB ATTUNE FB SZ7X5 (Insert) IMPLANT
KIT TURNOVER KIT A (KITS) ×1 IMPLANT
MANIFOLD NEPTUNE II (INSTRUMENTS) ×1 IMPLANT
NDL SAFETY ECLIP 18X1.5 (MISCELLANEOUS) ×1 IMPLANT
NS IRRIG 1000ML POUR BTL (IV SOLUTION) ×1 IMPLANT
PACK TOTAL KNEE CUSTOM (KITS) ×1 IMPLANT
PENCIL SMOKE EVACUATOR (MISCELLANEOUS) ×1 IMPLANT
PIN STEINMAN FIXATION KNEE (PIN) IMPLANT
PROTECTOR NERVE ULNAR (MISCELLANEOUS) ×1 IMPLANT
SET HNDPC FAN SPRY TIP SCT (DISPOSABLE) ×1 IMPLANT
SET PAD KNEE POSITIONER (MISCELLANEOUS) ×1 IMPLANT
SPIKE FLUID TRANSFER (MISCELLANEOUS) IMPLANT
SUT MNCRL AB 3-0 PS2 18 (SUTURE) IMPLANT
SUT STRATAFIX PDS+ 0 24IN (SUTURE) ×1 IMPLANT
SUT VIC AB 2-0 CT1 TAPERPNT 27 (SUTURE) ×2 IMPLANT
SUT VIC AB 3-0 SH 27X BRD (SUTURE) ×2 IMPLANT
SUTURE STRATFX MNCRL+ 3-0 PS-2 (SUTURE) ×1 IMPLANT
SYR 3ML LL SCALE MARK (SYRINGE) ×1 IMPLANT
TOWEL GREEN STERILE FF (TOWEL DISPOSABLE) ×1 IMPLANT
TRAY FOLEY MTR SLVR 16FR STAT (SET/KITS/TRAYS/PACK) ×1 IMPLANT
TUBE SUCTION HIGH CAP CLEAR NV (SUCTIONS) ×1 IMPLANT
WATER STERILE IRR 1000ML POUR (IV SOLUTION) ×2 IMPLANT
WRAP KNEE MAXI GEL POST OP (GAUZE/BANDAGES/DRESSINGS) ×1 IMPLANT

## 2024-05-18 NOTE — Transfer of Care (Signed)
 Immediate Anesthesia Transfer of Care Note  Patient: Micheal Hamilton  Procedure(s) Performed: ARTHROPLASTY, KNEE, TOTAL (Right: Knee)  Patient Location: PACU  Anesthesia Type:General  Level of Consciousness: drowsy and patient cooperative  Airway & Oxygen Therapy: Patient Spontanous Breathing and Patient connected to face mask oxygen  Post-op Assessment: Report given to RN and Post -op Vital signs reviewed and stable  Post vital signs: Reviewed and stable  Last Vitals:  Vitals Value Taken Time  BP 128/77 05/18/24 13:45  Temp    Pulse 76 05/18/24 13:46  Resp 18 05/18/24 13:46  SpO2 97 % 05/18/24 13:46  Vitals shown include unfiled device data.  Last Pain:  Vitals:   05/18/24 1105  TempSrc:   PainSc: 0-No pain         Complications: No notable events documented.

## 2024-05-18 NOTE — Anesthesia Procedure Notes (Signed)
 Procedure Name: MAC Date/Time: 05/18/2024 11:10 AM  Performed by: Franchot Delon RAMAN, CRNAPre-anesthesia Checklist: Patient identified, Emergency Drugs available, Suction available and Patient being monitored Oxygen Delivery Method: Simple face mask Ventilation: Oral airway inserted - appropriate to patient size and Nasal airway inserted- appropriate to patient size Placement Confirmation: positive ETCO2 Dental Injury: Teeth and Oropharynx as per pre-operative assessment

## 2024-05-18 NOTE — Progress Notes (Signed)
 Orthopedic Tech Progress Note Patient Details:  Micheal Hamilton 1951-06-06 [id number]  Ortho Devices Type of Ortho Device: Bone foam zero knee Ortho Device/Splint Location: right Ortho Device/Splint Interventions: Ordered, Application, Adjustment   Post Interventions Instructions Provided: Adjustment of device, Care of device  [name] 05/18/2024, 5:24 PM

## 2024-05-18 NOTE — Anesthesia Procedure Notes (Signed)
 Spinal  Patient location during procedure: OR Start time: 05/18/2024 11:24 AM End time: 05/18/2024 11:24 AM Reason for block: surgical anesthesia Staffing Performed: anesthesiologist  Anesthesiologist: Colhoun, Lauraine DASEN, MD Performed by: Hamilton Lauraine DASEN, MD Authorized by: Hamilton Lauraine DASEN, MD   Preanesthetic Checklist Completed: patient identified, IV checked, site marked, risks and benefits discussed, surgical consent, monitors and equipment checked, pre-op evaluation and timeout performed Spinal Block Patient position: sitting Prep: DuraPrep Patient monitoring: heart rate, cardiac monitor, continuous pulse ox and blood pressure Approach: midline Location: L3-4 Injection technique: single-shot Needle Needle type: Pencan  Needle gauge: 24 G Needle length: 9 cm Assessment Sensory level: Pending. Events: CSF return Additional Notes Patient identified. Risks/Benefits/Options discussed with patient including but not limited to bleeding, infection, nerve damage, paralysis, failed block, blood pressure changes. Confirmed with bedside nurse the patient's most recent platelet count. Confirmed with patient that they are not currently taking any anticoagulation, have any bleeding history or any family history of bleeding disorders. Sterile technique was used throughout the entire procedure as documented above. See intraoperative record for vital signs throughout.   Micheal Dene, MD

## 2024-05-18 NOTE — Anesthesia Preprocedure Evaluation (Addendum)
 Anesthesia Evaluation  Patient identified by MRN, date of birth, ID band Patient awake    Reviewed: Allergy & Precautions, NPO status , Patient's Chart, lab work & pertinent test results  History of Anesthesia Complications Negative for: history of anesthetic complications  Airway Mallampati: II  TM Distance: >3 FB Neck ROM: Full    Dental  (+) Teeth Intact, Dental Advisory Given, Chipped   Pulmonary former smoker   breath sounds clear to auscultation       Cardiovascular (-) angina (-) CAD (-) dysrhythmias  Rhythm:Regular Rate:Normal  TTE 2015: - Left ventricle: The cavity size was normal. Wall thickness    was increased in a pattern of mild LVH. Systolic function    was normal. The estimated ejection fraction was in the    range of 55% to 60%. Wall motion was normal; there were no    regional wall motion abnormalities. Doppler parameters are    consistent with abnormal left ventricular relaxation    (grade 1 diastolic dysfunction).  - Mitral valve: Mild regurgitation.  - Left atrium: The atrium was mildly dilated.     Neuro/Psych  PSYCHIATRIC DISORDERS Anxiety Depression       GI/Hepatic ,GERD  Medicated and Controlled,,  Endo/Other  Diabetes: Pre-diabetes (A1c of 6.3).    Renal/GU      Musculoskeletal  (+) Arthritis , Osteoarthritis,    Abdominal   Peds  Hematology  (+) Blood dyscrasia Hgb 17.0, Plts 297K (05/10/24)   Anesthesia Other Findings   Reproductive/Obstetrics                              Anesthesia Physical Anesthesia Plan  ASA: 2  Anesthesia Plan: MAC and Spinal   Post-op Pain Management: Regional block*   Induction: Intravenous  PONV Risk Score and Plan: 1 and Propofol  infusion and Treatment may vary due to age or medical condition  Airway Management Planned: Simple Face Mask and Natural Airway  Additional Equipment: None  Intra-op Plan:   Post-operative  Plan:   Informed Consent:   Plan Discussed with: CRNA  Anesthesia Plan Comments:          Anesthesia Quick Evaluation

## 2024-05-18 NOTE — Interval H&P Note (Signed)
 History and Physical Interval Note:  05/18/2024 9:47 AM  Micheal Hamilton  has presented today for surgery, with the diagnosis of osteoarthritis of right knee.  The various methods of treatment have been discussed with the patient and family. After consideration of risks, benefits and other options for treatment, the patient has consented to  Procedure(s): ARTHROPLASTY, KNEE, TOTAL (Right) as a surgical intervention.  The patient's history has been reviewed, patient examined, no change in status, stable for surgery.  I have reviewed the patient's chart and labs.  Questions were answered to the patient's satisfaction.     Fonda SQUIBB [place]

## 2024-05-18 NOTE — Op Note (Signed)
 DATE OF SURGERY:  05/18/2024 TIME: 1:02 PM  PATIENT NAME:  Micheal Hamilton   AGE: 73 y.o.    PRE-OPERATIVE DIAGNOSIS: Right knee primary localized osteoarthritis  POST-OPERATIVE DIAGNOSIS:  Same  PROCEDURE: Right total Knee Arthroplasty  SURGEON:  Fonda SQUIBB [place], MD   ASSISTANT:  Army Daring, PA-C, present and scrubbed throughout the case, critical for assistance with exposure, retraction, instrumentation, and closure.  OPERATIVE IMPLANTS: Implant Name: CEMENT HV SMART SET - [id number] Type: Cement Inv. Item: CEMENT HV SMART SET Serial No.:  Manufacturer: [employer] Lot No.: [id number] LRB: Right No. Used: 2 Action: Implanted   Implant Name: BASEPLATE TIB CEM ATTUNE SZ7 - [id number] Type: Knees Inv. Item: BASEPLATE TIB CEM ATTUNE SZ7 Serial No.:  Manufacturer: [employer] Lot No.: [id number] LRB: Right No. Used: 1 Action: Implanted   Implant Name: ATTUNE MED DOME PAT 38 KNEE - [id number] Type: Knees Inv. Item: ATTUNE MED DOME PAT 38 KNEE Serial No.:  Manufacturer: [employer] Lot No.: [id number] LRB: Right No. Used: 1 Action: Implanted   Implant Name: ATTUNE PS FEM RT SZ 7 CEM KNEE - [id number] Type: Femur Inv. Item: ATTUNE PS FEM RT SZ 7 CEM KNEE Serial No.:  Manufacturer: [employer] Lot No.: [id number] LRB: Right No. Used: 1 Action: Implanted   Implant Name: INSERT TIB ATTUNE FB [id number] - [id number] Type: Insert Inv. Item: INSERT TIB ATTUNE FB [id number] Serial No.:  Manufacturer: [employer] Lot No.: [id number] LRB: Right No. Used: 1 Action: Implanted   PREOPERATIVE INDICATIONS:  Micheal Hamilton is a 73 y.o. year old male with end stage bone on bone degenerative arthritis of the knee who failed conservative treatment, including injections, antiinflammatories, activity modification, and assistive devices, and had significant impairment of their activities of daily living, and elected for Total Knee Arthroplasty.    The risks, benefits, and alternatives were discussed at length including but not limited to the risks of infection, bleeding, nerve injury, stiffness, blood clots, the need for revision surgery, cardiopulmonary complications, among others, and they were willing to proceed.  OPERATIVE FINDINGS AND UNIQUE ASPECTS OF THE CASE: He had severe medial disease with what looks like a avascular collapse of almost the entirety of the weightbearing condyle.  The lateral side is intact, the patellofemoral joint had some extensive grade 3 changes.  The patella did not have too much in the way of disease.  Access around the knee was fairly challenging, in particular the medial side was scarred down.  ESTIMATED BLOOD LOSS: 150 mL  OPERATIVE DESCRIPTION:  The patient was brought to the operative room and placed in a supine position.  Anesthesia was administered.  IV antibiotics were given.  The lower extremity was prepped and draped in the usual sterile fashion.  Time out was performed.  The leg was elevated and exsanguinated and the tourniquet was inflated.  Anterior quadriceps tendon splitting approach was performed.  The patella was everted and osteophytes were removed.  The anterior horn of the medial and lateral meniscus was removed.   The patella was then measured, and cut with the saw.  The thickness before the cut was 25 and after the cut was 15.  A metal shield was used to protect the patella throughout the case.    The distal femur was opened with the drill and the intramedullary distal femoral cutting jig was utilized, set at 5 degrees resecting 9 mm off the distal femur.  Care was taken to protect the collateral ligaments.  Then the  extramedullary tibial cutting jig was utilized making the appropriate cut using the anterior tibial crest as a reference building in appropriate posterior slope.  Care was taken during the cut to protect the medial and collateral ligaments.  The proximal tibia was removed  along with the posterior horns of the menisci.  The PCL was sacrificed.    The extensor gap was measured and found to have adequate resection, measuring to a size 5.    The distal femoral sizing jig was applied, taking care to avoid notching.  This was set at 3 degrees of external rotation.  Then the 4-in-1 cutting jig was applied and the anterior and posterior femur was cut, along with the chamfer cuts.  All posterior osteophytes were removed.  The flexion gap was then measured and was symmetric with the extension gap.  I completed the distal femoral preparation using the appropriate jig to prepare the box.  The proximal tibia sized and prepared accordingly with the reamer and the punch, and then all components were trialed with the poly insert.  The knee was found to have excellent balance and full motion.    The above named components were then cemented into place and all excess cement was removed.  The real polyethylene implant was placed.  After the cement had cured I released the tourniquet and confirmed excellent hemostasis with no major posterior vessel injury.    The knee was easily taken through a range of motion and the patella tracked well and the knee irrigated copiously and the parapatellar tissue closed with Stratafix and vicryl, and subcutaneous tissue closed with vicryl, and monocryl with steri strips for the skin.  The wounds were injected with marcaine , and dressed with sterile gauze and the patient was awakened and returned to the PACU in stable and satisfactory condition.  There were no complications.  Total tourniquet time was approximately 75 minutes.

## 2024-05-18 NOTE — Evaluation (Signed)
 Physical Therapy Evaluation Patient Details Name: Micheal Hamilton MRN: [id number] DOB: [birth year] Today's Date: 05/18/2024  History of Present Illness  Pt is 73 yo male s/p R TKA on 05/18/24.  Pt with hx including but not limited to GERD, HLD, pre-diabetes, arthritis, pt reports bil LE neuropathy  Clinical Impression  Pt is s/p TKA resulting in the deficits listed below (see PT Problem List). At baseline, pt acitve and independent.  He has home support and RW has been delivered in room.  Pt does have a flight of steps at home to navigate to get to bedroom.  Today, pt with excellent quad activation, ROM, and pain control.  He ambulated 45' and performed steps without difficulty.  Pt was educated on HEP with good understanding and written instruction, he has outpt PT scheduled next week. Pt demonstrates safe gait & transfers in order to return home from PT perspective once discharged by MD.  While in hospital, will continue to benefit from PT for skilled therapy to advance mobility and exercises.            If plan is discharge home, recommend the following: A little help with walking and/or transfers;A little help with bathing/dressing/bathroom;Assistance with cooking/housework;Help with stairs or ramp for entrance   Can travel by private vehicle        Equipment Recommendations Rolling walker (2 wheels)  Recommendations for Other Services       Functional Status Assessment Patient has had a recent decline in their functional status and demonstrates the ability to make significant improvements in function in a reasonable and predictable amount of time.     Precautions / Restrictions Precautions Precautions: Fall;Knee Restrictions RLE Weight Bearing Per Provider Order: Weight bearing as tolerated      Mobility  Bed Mobility Overal bed mobility: Needs Assistance Bed Mobility: Supine to Sit, Sit to Supine     Supine to sit: Supervision Sit to supine: Supervision   General bed  mobility comments: educated on using gait belt to assist R LE; performed wtihout difficulty    Transfers Overall transfer level: Needs assistance Equipment used: Rolling walker (2 wheels) Transfers: Sit to/from Stand Sit to Stand: Contact guard assist           General transfer comment: CGA for safety; STS x 2 with cues for hand placement    Ambulation/Gait Ambulation/Gait assistance: Contact guard assist Gait Distance (Feet): 80 Feet Assistive device: Rolling walker (2 wheels) Gait Pattern/deviations: Step-to pattern, Decreased stride length, Decreased weight shift to right Gait velocity: decreased but functional     General Gait Details: cues for small steps for pain control; initial education on sequencing and RW proximity wtih good carryover  Stairs Stairs: Yes Stairs assistance: Contact guard assist Stair Management: Step to pattern Number of Stairs: 10 General stair comments: Started wtih bil rails and progressed to single rail and cane forward, single rail sideways, and cane then reaching up like door frame to simulate all options at home.  Pt preferred sideways for his flight of steps.  Performed safely and without difficulty  Wheelchair Mobility     Tilt Bed    Modified Rankin (Stroke Patients Only)       Balance Overall balance assessment: Needs assistance Sitting-balance support: No upper extremity supported Sitting balance-Leahy Scale: Good     Standing balance support: Bilateral upper extremity supported, No upper extremity supported Standing balance-Leahy Scale: Fair Standing balance comment: RW to ambulate but could stand without support  Pertinent Vitals/Pain Pain Assessment Pain Assessment: 0-10 Pain Score: 2  Pain Location: R knee Pain Descriptors / Indicators: Discomfort Pain Intervention(s): Limited activity within patient's tolerance, Monitored during session, Premedicated before session,  Repositioned    Home Living Family/patient expects to be discharged to:: Private residence Living Arrangements: Spouse/significant other (dtrs also coming to assist) Available Help at Discharge: Family;Available 24 hours/day Type of Home: House Home Access: Stairs to enter   [employer] of Steps: 2 small steps in from garage (wall on left, no rail) and 2 steps between kitchen and den (could hold door frame) Alternate Level Stairs-Number of Steps: flight no landing; carpet steps Home Layout: Multi-level;1/2 bath on main level (Does have recliner first floor and could stay couple days down if needed) Home Equipment: Cane - single point;Tub bench;Hand held shower head      Prior Function Prior Level of Function : Independent/Modified Independent;Working/employed;Driving             Mobility Comments: could ambulate in community; works part time ADLs Comments: independent adls and iadls     Extremity/Trunk Assessment   Upper Extremity Assessment Upper Extremity Assessment: Overall WFL for tasks assessed    Lower Extremity Assessment Lower Extremity Assessment: RLE deficits/detail;LLE deficits/detail RLE Deficits / Details: Expected post op changes; ROM Knee 5 to 80 degrees; MMT: ankle 5/5, knee and hip 3/5 not further tested; SLR no ext lag RLE Sensation: WNL LLE Deficits / Details: ROM WFL; MMT 5/5 LLE Sensation: WNL    Cervical / Trunk Assessment Cervical / Trunk Assessment: Normal  Communication   Communication Factors Affecting Communication: Hearing impaired    Cognition Arousal: Alert Behavior During Therapy: WFL for tasks assessed/performed   PT - Cognitive impairments: No apparent impairments                                 Cueing       General Comments General comments (skin integrity, edema, etc.): VSS; unable to void with attempt with walk to bathroom - RN notified  Educated on safe ice use, no pivots, car transfers, resting  with leg straight, and TED hose during day. Also, encouraged walking every 1-2 hours during day. Educated on HEP with focus on mobility the first weeks. Discussed doing exercises within pain control and if pain increasing could decreased ROM, reps, and stop exercises as needed. Encouraged to perform quad sets and ankle pumps frequently for blood flow and to promote full knee extension.     Exercises Total Joint Exercises Ankle Circles/Pumps: AROM, Both, 10 reps, Supine Quad Sets: AROM, Right, 5 reps, Supine Heel Slides: Right, AAROM, 5 reps, Supine Hip ABduction/ADduction: AAROM, Right, 5 reps, Supine Long Arc Quad: AROM, Right, 5 reps, Seated Knee Flexion: AROM, Right, 5 reps, Seated   Assessment/Plan    PT Assessment Patient needs continued PT services  PT Problem List Decreased strength;Decreased mobility;Decreased range of motion;Decreased activity tolerance;Decreased balance;Decreased knowledge of use of DME;Pain       PT Treatment Interventions Therapeutic exercise;DME instruction;Gait training;Stair training;Functional mobility training;Therapeutic activities;Patient/family education;Modalities;Balance training    PT Goals (Current goals can be found in the Care Plan section)  Acute Rehab PT Goals Patient Stated Goal: return home PT Goal Formulation: With patient/family Time For Goal Achievement: 06/01/24 Potential to Achieve Goals: Good    Frequency 7X/week     Co-evaluation               AM-PAC  PT 6 Clicks Mobility  Outcome Measure Help needed turning from your back to your side while in a flat bed without using bedrails?: A Little Help needed moving from lying on your back to sitting on the side of a flat bed without using bedrails?: A Little Help needed moving to and from a bed to a chair (including a wheelchair)?: A Little Help needed standing up from a chair using your arms (e.g., wheelchair or bedside chair)?: A Little Help needed to walk in hospital  room?: A Little Help needed climbing 3-5 steps with a railing? : A Little 6 Click Score: 18    End of Session Equipment Utilized During Treatment: Gait belt Activity Tolerance: Patient tolerated treatment well Patient left: in bed;with family/visitor present (PACU) Nurse Communication: Mobility status PT Visit Diagnosis: Other abnormalities of gait and mobility (R26.89);Muscle weakness (generalized) (M62.81)    Time: 8343-8247 PT Time Calculation (min) (ACUTE ONLY): 56 min   Charges:   PT Evaluation $PT Eval Low Complexity: 1 Low PT Treatments $Gait Training: 8-22 mins $Therapeutic Exercise: 8-22 mins $Therapeutic Activity: 8-22 mins PT General Charges $$ ACUTE PT VISIT: 1 Visit         Benjiman, PT Acute Rehab [facility] [phone]   Benjiman VEAR Mulberry 05/18/2024, 6:08 PM

## 2024-05-18 NOTE — Anesthesia Procedure Notes (Signed)
 Anesthesia Regional Block: Adductor canal block   Pre-Anesthetic Checklist: , timeout performed,  Correct Patient, Correct Site, Correct Laterality,  Correct Procedure, Correct Position, site marked,  Risks and benefits discussed,  Surgical consent,  Pre-op evaluation,  At surgeon's request and post-op pain management  Laterality: Lower and Right  Prep: chloraprep       Needles:  Injection technique: Single-shot  Needle Type: Echogenic Stimulator Needle     Needle Length: 10cm  Needle Gauge: 20     Additional Needles:   Procedures:,,,, ultrasound used (permanent image in chart),, #20gu IV placed    Narrative:  Start time: 05/18/2024 10:45 AM End time: 05/18/2024 10:45 AM Injection made incrementally with aspirations every 5 mL.  Performed by: Personally  Anesthesiologist: Colhoun, Lauraine DASEN, MD  Additional Notes: Timeout performed with bedside RN - Name, DOB, allergies and laterality confirmed by the patient and RN. Surgical marking performed/confirmed. Anticoagulation status and most recent platelet count reviewed. Patient placed in a frog-leg position and sedation (as documented by RN) given via PIV. Peripheral nerve block performed as documented above. VSS throughout (see Flowchart).

## 2024-05-19 ENCOUNTER — Encounter (HOSPITAL_COMMUNITY): Payer: Self-pay | Admitting: Orthopedic Surgery

## 2024-05-20 ENCOUNTER — Encounter (HOSPITAL_COMMUNITY): Payer: Self-pay | Admitting: Orthopedic Surgery

## 2024-05-20 NOTE — Anesthesia Postprocedure Evaluation (Signed)
 Anesthesia Post Note  Patient: Micheal Hamilton  Procedure(s) Performed: ARTHROPLASTY, KNEE, TOTAL (Right: Knee)     Patient location during evaluation: PACU Anesthesia Type: MAC and Spinal Level of consciousness: awake Pain management: pain level controlled Vital Signs Assessment: post-procedure vital signs reviewed and stable Respiratory status: spontaneous breathing Cardiovascular status: blood pressure returned to baseline Postop Assessment: spinal receding and no apparent nausea or vomiting Anesthetic complications: no   No notable events documented.           Lauraine DASEN Colhoun

## 2024-05-24 DIAGNOSIS — M1712 Unilateral primary osteoarthritis, left knee: Secondary | ICD-10-CM | POA: Diagnosis not present

## 2024-05-31 DIAGNOSIS — M1712 Unilateral primary osteoarthritis, left knee: Secondary | ICD-10-CM | POA: Diagnosis not present

## 2024-05-31 DIAGNOSIS — M170 Bilateral primary osteoarthritis of knee: Secondary | ICD-10-CM | POA: Diagnosis not present

## 2024-05-31 DIAGNOSIS — Z96651 Presence of right artificial knee joint: Secondary | ICD-10-CM | POA: Diagnosis not present

## 2024-06-17 ENCOUNTER — Other Ambulatory Visit: Payer: Self-pay | Admitting: Family Medicine

## 2024-06-22 ENCOUNTER — Ambulatory Visit: Admitting: Family Medicine
